# Patient Record
Sex: Male | Born: 1975 | Race: White | Hispanic: No | Smoking: Current every day smoker
Health system: Southern US, Community
[De-identification: ages and names within clinical notes are randomized; demographics above are authoritative.]

## PROBLEM LIST (undated history)

## (undated) ENCOUNTER — Emergency Department (HOSPITAL_COMMUNITY): Admission: EM | Payer: Self-pay | Source: Home / Self Care

## (undated) DIAGNOSIS — A4902 Methicillin resistant Staphylococcus aureus infection, unspecified site: Secondary | ICD-10-CM

## (undated) DIAGNOSIS — E7800 Pure hypercholesterolemia, unspecified: Secondary | ICD-10-CM

## (undated) DIAGNOSIS — N289 Disorder of kidney and ureter, unspecified: Secondary | ICD-10-CM

## (undated) DIAGNOSIS — L0293 Carbuncle, unspecified: Secondary | ICD-10-CM

## (undated) DIAGNOSIS — G4730 Sleep apnea, unspecified: Secondary | ICD-10-CM

---

## 2001-03-14 ENCOUNTER — Ambulatory Visit: Admission: RE | Admit: 2001-03-14 | Discharge: 2001-03-14 | Payer: Self-pay | Admitting: Internal Medicine

## 2001-08-14 ENCOUNTER — Emergency Department (HOSPITAL_COMMUNITY): Admission: EM | Admit: 2001-08-14 | Discharge: 2001-08-14 | Payer: Self-pay | Admitting: Emergency Medicine

## 2001-08-14 ENCOUNTER — Encounter: Payer: Self-pay | Admitting: Emergency Medicine

## 2002-01-01 ENCOUNTER — Emergency Department (HOSPITAL_COMMUNITY): Admission: EM | Admit: 2002-01-01 | Discharge: 2002-01-01 | Payer: Self-pay | Admitting: Emergency Medicine

## 2002-01-01 ENCOUNTER — Encounter: Payer: Self-pay | Admitting: Emergency Medicine

## 2003-02-25 ENCOUNTER — Encounter: Payer: Self-pay | Admitting: Orthopedic Surgery

## 2003-02-25 ENCOUNTER — Ambulatory Visit (HOSPITAL_COMMUNITY): Admission: RE | Admit: 2003-02-25 | Discharge: 2003-02-25 | Payer: Self-pay | Admitting: Orthopedic Surgery

## 2003-07-10 ENCOUNTER — Other Ambulatory Visit: Admission: RE | Admit: 2003-07-10 | Discharge: 2003-07-10 | Payer: Self-pay | Admitting: Urology

## 2005-10-01 ENCOUNTER — Emergency Department (HOSPITAL_COMMUNITY): Admission: EM | Admit: 2005-10-01 | Discharge: 2005-10-01 | Payer: Self-pay | Admitting: Emergency Medicine

## 2005-10-12 ENCOUNTER — Ambulatory Visit (HOSPITAL_COMMUNITY): Admission: RE | Admit: 2005-10-12 | Discharge: 2005-10-12 | Payer: Self-pay | Admitting: Internal Medicine

## 2005-11-11 ENCOUNTER — Ambulatory Visit (HOSPITAL_COMMUNITY): Admission: RE | Admit: 2005-11-11 | Discharge: 2005-11-11 | Payer: Self-pay | Admitting: Internal Medicine

## 2005-12-03 ENCOUNTER — Emergency Department (HOSPITAL_COMMUNITY): Admission: EM | Admit: 2005-12-03 | Discharge: 2005-12-03 | Payer: Self-pay | Admitting: Emergency Medicine

## 2005-12-04 ENCOUNTER — Inpatient Hospital Stay (HOSPITAL_COMMUNITY): Admission: EM | Admit: 2005-12-04 | Discharge: 2005-12-07 | Payer: Self-pay | Admitting: Emergency Medicine

## 2006-02-14 ENCOUNTER — Ambulatory Visit (HOSPITAL_COMMUNITY): Admission: RE | Admit: 2006-02-14 | Discharge: 2006-02-14 | Payer: Self-pay | Admitting: Internal Medicine

## 2006-04-02 ENCOUNTER — Emergency Department (HOSPITAL_COMMUNITY): Admission: EM | Admit: 2006-04-02 | Discharge: 2006-04-02 | Payer: Self-pay | Admitting: Emergency Medicine

## 2006-06-17 ENCOUNTER — Emergency Department (HOSPITAL_COMMUNITY): Admission: EM | Admit: 2006-06-17 | Discharge: 2006-06-17 | Payer: Self-pay | Admitting: Emergency Medicine

## 2007-03-18 ENCOUNTER — Emergency Department (HOSPITAL_COMMUNITY): Admission: EM | Admit: 2007-03-18 | Discharge: 2007-03-18 | Payer: Self-pay | Admitting: Emergency Medicine

## 2007-03-31 ENCOUNTER — Emergency Department (HOSPITAL_COMMUNITY): Admission: EM | Admit: 2007-03-31 | Discharge: 2007-03-31 | Payer: Self-pay | Admitting: Emergency Medicine

## 2007-09-24 ENCOUNTER — Emergency Department (HOSPITAL_COMMUNITY): Admission: EM | Admit: 2007-09-24 | Discharge: 2007-09-24 | Payer: Self-pay | Admitting: Emergency Medicine

## 2007-09-28 ENCOUNTER — Emergency Department (HOSPITAL_COMMUNITY): Admission: EM | Admit: 2007-09-28 | Discharge: 2007-09-29 | Payer: Self-pay | Admitting: Emergency Medicine

## 2007-10-03 ENCOUNTER — Ambulatory Visit: Payer: Self-pay | Admitting: Pulmonary Disease

## 2007-10-03 DIAGNOSIS — G4733 Obstructive sleep apnea (adult) (pediatric): Secondary | ICD-10-CM | POA: Insufficient documentation

## 2007-11-01 ENCOUNTER — Encounter: Payer: Self-pay | Admitting: Pulmonary Disease

## 2007-11-15 ENCOUNTER — Encounter: Payer: Self-pay | Admitting: Pulmonary Disease

## 2010-03-14 ENCOUNTER — Emergency Department (HOSPITAL_COMMUNITY): Admission: EM | Admit: 2010-03-14 | Discharge: 2010-03-14 | Payer: Self-pay | Admitting: Emergency Medicine

## 2010-09-24 NOTE — Discharge Summary (Signed)
[name], Dylan Shelton NO.:  [id number]   MEDICAL RECORD NO.:  [id number]          PATIENT TYPE:  INP   LOCATION:  A331                          FACILITY:  [facility]   PHYSICIAN:  Kingsley Callander. Ouida Sills, MD       DATE OF BIRTH:  1976/04/08   DATE OF ADMISSION:  12/04/2005  DATE OF DISCHARGE:  08/01/2007LH                                 DISCHARGE SUMMARY   DISCHARGE DIAGNOSIS:  Left thigh boil with cellulitis   HOSPITAL COURSE:  This patient is a 35 year old male who presented with  swelling, redness and induration in the left inner thigh.  He had undergone  incision and drainage in the emergency room 1 day prior to admission.  He  returned with expanding erythema and induration involving most of the inner  left thigh.  His white count was 13.1.  Blood cultures were drawn and he was  started on IV vancomycin.  Blood cultures remained negative, fever resolved.  He was seen in follow-up by Dr. Lovell Sheehan.  Additional pus was expressed but  he did not require an additional procedure.  His erythema and induration  markedly improved by 8/1 and he is felt to be stable for discharge with  outpatient oral antibiotic therapy   He will follow-up in the office in next Monday.  He will remain out of work  until then.   DISCHARGE MEDICATIONS:  Bactrim DS b.i.d.   He will also use warm moist compresses to the boil area q.i.d.      Kingsley Callander. Ouida Sills, MD  Electronically Signed     ROF/MEDQ  D:  12/07/2005  T:  12/07/2005  Job:  [id number]

## 2010-09-24 NOTE — H&P (Signed)
Dylan Shelton, BISHOP NO.:  [id number]   MEDICAL RECORD NO.:  [id number]          PATIENT TYPE:  [facility]   LOCATION:  A331                          FACILITY:  [facility]   PHYSICIAN:  Kingsley Callander. Ouida Sills, MD       DATE OF BIRTH:  01-24-1976   DATE OF ADMISSION:  12/04/2005  DATE OF DISCHARGE:  LH                                HISTORY & PHYSICAL   HISTORY OF PRESENT ILLNESS:  This patient is a 35 year old white male who  presented to the emergency room with an enlarging area of redness  surrounding a boil on the left inner thigh.  Had been seen in the emergency  room one day prior to that and had had an incision and drainage with a small  amount of packing being placed.  He had been treated with Vicodin and  Bactrim.  He returned, though, with a much more extensive area of redness  involving the thigh.  He was not febrile.  He had a mild leukocytosis.  He  was hospitalized and started on IV antibiotics.   PAST MEDICAL HISTORY:  1. Sleep apnea.  2. 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   MEDICATIONS:  Bactrim, Vicodin.   ALLERGIES:  None.   SOCIAL HISTORY:  He smokes a pack cigarettes per day.  He does not use drugs  or alcohol.   REVIEW OF SYSTEMS:  Noncontributory.   PHYSICAL EXAMINATION:  VITAL SIGNS:  Temperature 98.7, blood pressure  123/67, pulse 75, respirations 20.  GENERAL:  Alert and oriented.  HEENT:  No scleral icterus.  Pharynx normal.  NECK:  Supple without thyromegaly or lymphadenopathy.  LUNGS:  Clear.  HEART:  Regular with no murmurs.  ABDOMEN:  Nontender.  No hepatosplenomegaly.  EXTREMITIES:  He had golf ball sized mass in the left inner thigh.  Small  amount plus was expressed.  There is surrounding erythema encompassing most  of the left inner thigh.  The right leg is normal.  NEURO:  Grossly intact.  Lymph nodes no enlargement.   LABORATORY DATA:  White count 13.1, hemoglobin 14.5, platelets 255,000.  Sodium 138, potassium 4.3,  bicarbonate 28, glucose 88, BUN 9, creatinine  1.1, calcium 8.2.   IMPRESSION:  Left thigh boil/abscess with surrounding cellulitis.  Blood  cultures have been drawn.  He has been started on intravenous vancomycin.  Surgery will be consulted for additional incision and drainage.      Kingsley Callander. Ouida Sills, MD  Electronically Signed     ROF/MEDQ  D:  12/05/2005  T:  12/05/2005  Job:  [id number]

## 2011-01-30 ENCOUNTER — Emergency Department (HOSPITAL_COMMUNITY)
Admission: EM | Admit: 2011-01-30 | Discharge: 2011-01-30 | Disposition: A | Discharge status: Home / Self Care | Payer: BC Managed Care – PPO | Attending: Emergency Medicine | Admitting: Emergency Medicine

## 2011-01-30 ENCOUNTER — Encounter: Payer: Self-pay | Admitting: *Deleted

## 2011-01-30 DIAGNOSIS — Z8614 Personal history of Methicillin resistant Staphylococcus aureus infection: Secondary | ICD-10-CM | POA: Insufficient documentation

## 2011-01-30 DIAGNOSIS — F17200 Nicotine dependence, unspecified, uncomplicated: Secondary | ICD-10-CM | POA: Insufficient documentation

## 2011-01-30 DIAGNOSIS — L989 Disorder of the skin and subcutaneous tissue, unspecified: Secondary | ICD-10-CM | POA: Insufficient documentation

## 2011-01-30 HISTORY — DX: Disorder of kidney and ureter, unspecified: N28.9

## 2011-01-30 HISTORY — DX: Carbuncle, unspecified: L02.93

## 2011-01-30 HISTORY — DX: Methicillin resistant Staphylococcus aureus infection, unspecified site: A49.02

## 2011-01-30 HISTORY — DX: Sleep apnea, unspecified: G47.30

## 2011-01-30 MED ORDER — DOXYCYCLINE HYCLATE 100 MG PO CAPS: 100.0000 mg | ORAL_CAPSULE | Freq: Two times a day (BID) | ORAL | Status: AC

## 2011-01-30 MED ORDER — DOXYCYCLINE HYCLATE 100 MG PO TABS
100.0000 mg | ORAL_TABLET | Freq: Once | ORAL | Status: AC
  Administered 2011-01-30: 100 mg via ORAL
  Filled 2011-01-30: qty 1

## 2011-01-30 MED ORDER — IBUPROFEN 800 MG PO TABS
800.0000 mg | ORAL_TABLET | Freq: Once | ORAL | Status: AC
  Administered 2011-01-30: 800 mg via ORAL
  Filled 2011-01-30: qty 1

## 2011-01-30 NOTE — ED Notes (Signed)
Pt c/o abscess to back of head x 3-4 days ago

## 2011-01-30 NOTE — ED Provider Notes (Signed)
History     CSN: [id number] Arrival date & time: 01/30/2011  3:11 PM  Chief Complaint  Patient presents with  . Abscess    HPI  (Consider location/radiation/quality/duration/timing/severity/associated sxs/prior treatment)  HPI Comments: Pt has had multiple MRSA lesions in the past.  He has a small area to the L parietal scalp that he thought was a developing abscess.  His spouse cut it with a razor last PM but saw no drainage.  Denies fever.  Patient is a 35 y.o. male presenting with abscess. The history is provided by the patient. No language interpreter was used.  Abscess  This is a new problem. The current episode started less than one week ago. The onset was gradual. The problem has been unchanged. The abscess is present on the scalp. The problem is mild. The abscess is characterized by painfulness. It is unknown what he was exposed to. The abscess first occurred at home. His past medical history is significant for skin abscesses in family. There were no sick contacts. He has received no recent medical care.    Past Medical History  Diagnosis Date  . Renal disorder   . Recurrent boils   . MRSA (methicillin resistant Staphylococcus aureus)   . Sleep apnea     History reviewed. No pertinent past surgical history.  History reviewed. No pertinent family history.  History  Substance Use Topics  . Smoking status: Current Everyday Smoker    Types: Cigarettes  . Smokeless tobacco: Not on file  . Alcohol Use: Yes     occasionally      Review of Systems  Review of Systems  Skin:       Skin lesion  All other systems reviewed and are negative.    Allergies  Review of patient's allergies indicates not on file.  Home Medications  No current outpatient prescriptions on file.  Physical Exam    BP 127/79  Pulse 62  Temp(Src) 98.7 F (37.1 C) (Oral)  Resp 18  Ht 5\' 10"  (1.778 m)  Wt 210 lb (95.255 kg)  BMI 30.13 kg/m2  SpO2 99%  Physical Exam  Nursing note and  vitals reviewed. Constitutional: He is oriented to person, place, and time. He appears well-developed and well-nourished. No distress.  HENT:  Head: Normocephalic and atraumatic.    Eyes: EOM are normal.  Neck: Normal range of motion.  Cardiovascular: Normal rate, regular rhythm and intact distal pulses.   Pulmonary/Chest: Effort normal. No respiratory distress.  Abdominal: Soft.  Musculoskeletal: Normal range of motion.  Neurological: He is alert and oriented to person, place, and time.  Skin: Skin is warm and dry. Lesion noted. He is not diaphoretic.  Psychiatric: He has a normal mood and affect. Judgment normal.    ED Course  Procedures (including critical care time)  Labs Reviewed - No data to display No results found.   No diagnosis found.   MDM         Worthy Rancher, PA 01/30/11 [phone]

## 2011-01-30 NOTE — ED Notes (Signed)
Pt has small abscess on left occipital  Lower scalp.  Area cleansed with SAF-CLEN.  Fingerling  Like abscess noted that radiates toward neck.

## 2011-02-02 LAB — URINALYSIS, ROUTINE W REFLEX MICROSCOPIC
Bilirubin Urine: NEGATIVE
Ketones, ur: 15 — AB
Nitrite: NEGATIVE
Nitrite: NEGATIVE
Protein, ur: NEGATIVE
Specific Gravity, Urine: 1.02
Specific Gravity, Urine: 1.025
Urobilinogen, UA: 0.2
Urobilinogen, UA: 0.2

## 2011-02-02 LAB — BASIC METABOLIC PANEL
BUN: 16
CO2: 20
Calcium: 9.6
Chloride: 106
GFR calc Af Amer: >60
GFR calc non Af Amer: >60
Glucose, Bld: 105 — ABNORMAL HIGH
Glucose, Bld: 113 — ABNORMAL HIGH
Potassium: 3 — ABNORMAL LOW
Sodium: 136

## 2011-02-02 LAB — DIFFERENTIAL
Basophils Absolute: 0.1
Basophils Absolute: 0.1
Basophils Relative: 1
Basophils Relative: 1
Eosinophils Relative: 1
Eosinophils Relative: 2
Lymphocytes Relative: 48 — ABNORMAL HIGH
Monocytes Absolute: 1
Monocytes Relative: 6
Neutro Abs: 5.9

## 2011-02-02 LAB — URINE MICROSCOPIC-ADD ON

## 2011-02-02 LAB — CBC
HCT: 42
HCT: 42.2
Hemoglobin: 14.9
MCHC: 35
MCHC: 35.4
MCV: 89.1
Platelets: 260
RBC: 4.72
RDW: 12.4
RDW: 12.4

## 2011-02-25 NOTE — ED Provider Notes (Signed)
Evaluation and management procedures were performed by the PA/NP under my supervision/collaboration.    Kaedance Magos D Euva Rundell, MD 02/25/11 1806 

## 2012-01-19 ENCOUNTER — Emergency Department (HOSPITAL_COMMUNITY)
Admission: EM | Admit: 2012-01-19 | Discharge: 2012-01-20 | Disposition: A | Discharge status: Home / Self Care | Payer: BC Managed Care – PPO | Attending: Emergency Medicine | Admitting: Emergency Medicine

## 2012-01-19 ENCOUNTER — Encounter (HOSPITAL_COMMUNITY): Payer: Self-pay

## 2012-01-19 ENCOUNTER — Emergency Department (HOSPITAL_COMMUNITY)
Admission: EM | Admit: 2012-01-19 | Discharge: 2012-01-19 | Disposition: A | Discharge status: Home / Self Care | Payer: BC Managed Care – PPO | Attending: Emergency Medicine | Admitting: Emergency Medicine

## 2012-01-19 ENCOUNTER — Encounter (HOSPITAL_COMMUNITY): Payer: Self-pay | Admitting: Emergency Medicine

## 2012-01-19 DIAGNOSIS — N289 Disorder of kidney and ureter, unspecified: Secondary | ICD-10-CM | POA: Insufficient documentation

## 2012-01-19 DIAGNOSIS — L0211 Cutaneous abscess of neck: Secondary | ICD-10-CM | POA: Insufficient documentation

## 2012-01-19 DIAGNOSIS — G4730 Sleep apnea, unspecified: Secondary | ICD-10-CM | POA: Insufficient documentation

## 2012-01-19 DIAGNOSIS — F17200 Nicotine dependence, unspecified, uncomplicated: Secondary | ICD-10-CM | POA: Insufficient documentation

## 2012-01-19 DIAGNOSIS — L03221 Cellulitis of neck: Secondary | ICD-10-CM | POA: Insufficient documentation

## 2012-01-19 DIAGNOSIS — L0291 Cutaneous abscess, unspecified: Secondary | ICD-10-CM

## 2012-01-19 DIAGNOSIS — Z8614 Personal history of Methicillin resistant Staphylococcus aureus infection: Secondary | ICD-10-CM | POA: Insufficient documentation

## 2012-01-19 DIAGNOSIS — L02219 Cutaneous abscess of trunk, unspecified: Secondary | ICD-10-CM | POA: Insufficient documentation

## 2012-01-19 DIAGNOSIS — E7800 Pure hypercholesterolemia, unspecified: Secondary | ICD-10-CM | POA: Insufficient documentation

## 2012-01-19 HISTORY — DX: Pure hypercholesterolemia, unspecified: E78.00

## 2012-01-19 MED ORDER — SULFAMETHOXAZOLE-TRIMETHOPRIM 800-160 MG PO TABS: 1.0000 | ORAL_TABLET | Freq: Two times a day (BID) | ORAL | Status: AC

## 2012-01-19 MED ORDER — SULFAMETHOXAZOLE-TMP DS 800-160 MG PO TABS
1.0000 | ORAL_TABLET | Freq: Once | ORAL | Status: AC
  Administered 2012-01-19: 1 via ORAL
  Filled 2012-01-19: qty 1

## 2012-01-19 MED ORDER — CEPHALEXIN 500 MG PO CAPS
500.0000 mg | ORAL_CAPSULE | Freq: Once | ORAL | Status: AC
  Administered 2012-01-19: 500 mg via ORAL
  Filled 2012-01-19: qty 1

## 2012-01-19 MED ORDER — HYDROCODONE-ACETAMINOPHEN 5-325 MG PO TABS
2.0000 | ORAL_TABLET | Freq: Once | ORAL | Status: AC
  Administered 2012-01-19: 2 via ORAL
  Filled 2012-01-19: qty 2

## 2012-01-19 NOTE — ED Provider Notes (Signed)
History  This chart was scribed for Dylan Kaplan, MD by [name]. This patient was seen in room APA11/[facility]11 and the patient's care was started at 2142.   CSN: [id number]  Arrival date & time 01/19/12  2142   First MD Initiated Contact with Patient 01/19/12 2158      Chief Complaint  Patient presents with  . Recurrent Skin Infections   The history is provided by the patient. No language interpreter was used.   Dylan Shelton is a 36 y.o. male who presents to the Emergency Department complaining of a posterior neck abscess that was recently drained yesterday and has gradually worsened in pain and swelling. He states that he was taking a shower about 10 hours ago and the packing fell out from previous I&D from about 24 hours ago. He states since then it has been more painful and he thinks there is pus in it again. He denies any fever.    Past Medical History  Diagnosis Date  . Renal disorder   . Recurrent boils   . MRSA (methicillin resistant Staphylococcus aureus)   . Sleep apnea   . High cholesterol     History reviewed. No pertinent past surgical history.  History reviewed. No pertinent family history.  History  Substance Use Topics  . Smoking status: Current Every Day Smoker    Types: Cigarettes  . Smokeless tobacco: Not on file  . Alcohol Use: Yes     occasionally      Review of Systems  Constitutional: Negative for fever and chills.  HENT: Negative for congestion and rhinorrhea.   Respiratory: Negative for shortness of breath.   Cardiovascular: Negative for chest pain.  Gastrointestinal: Negative for nausea, vomiting and abdominal pain.  Skin: Positive for wound (abscess on the back of his neck. ).  Neurological: Negative for weakness.  All other systems reviewed and are negative.    Allergies  Review of patient's allergies indicates no known allergies.  Home Medications   Current Outpatient Rx  Name Route Sig Dispense Refill  .  SULFAMETHOXAZOLE-TRIMETHOPRIM 800-160 MG PO TABS Oral Take 1 tablet by mouth every 12 (twelve) hours. 10 tablet 0    Triage Vitals: BP 148/81  Pulse 81  Temp 98.8 F (37.1 C) (Oral)  Resp 16  Ht 5\' 10"  (1.778 m)  Wt 205 lb (92.987 kg)  BMI 29.41 kg/m2  SpO2 98%  Physical Exam  Nursing note and vitals reviewed. Constitutional: He is oriented to person, place, and time. He appears well-developed and well-nourished. No distress.  HENT:  Head: Normocephalic and atraumatic.  Eyes: EOM are normal.  Neck: Neck supple. No tracheal deviation present.  Cardiovascular: Normal rate, regular rhythm and normal heart sounds.   Pulmonary/Chest: Effort normal and breath sounds normal. No respiratory distress. He has no wheezes. He has no rales.  Musculoskeletal: Normal range of motion.  Neurological: He is alert and oriented to person, place, and time.  Skin: Skin is warm and dry.       Base of neck with a 9 x 4 cm lesion which is indurated erythematous and has middle white pustule. No fluctuance, little purulent discharge.   Psychiatric: He has a normal mood and affect. His behavior is normal.    ED Course  INCISION AND DRAINAGE Date/Time: 01/20/2012 12:28 AM Performed by: Dylan Shelton Authorized by: Dylan Shelton Consent: Verbal consent obtained. Risks and benefits: risks, benefits and alternatives were discussed Consent given by: patient Site marked: the operative site was marked  Patient identity confirmed: verbally with patient Type: abscess Body area: head/neck Anesthesia: local infiltration Local anesthetic: lidocaine 1% with epinephrine Anesthetic total: 5 ml Patient sedated: no Scalpel size: 11 Needle gauge: 20 Incision type: single straight Complexity: simple Drainage: purulent and bloody Drainage amount: scant Wound treatment: wound left open and drain placed Packing material: 1/2 in iodoform gauze Patient tolerance: Patient tolerated the procedure well with no  immediate complications.   (including critical care time) DIAGNOSTIC STUDIES: Oxygen Saturation is 98% on room air, normal by my interpretation.    COORDINATION OF CARE: At 1020 PM Discussed treatment plan with patient which includes pain medicine, and antibiotics. Patient agrees.   Labs Reviewed - No data to display No results found.   No diagnosis found.    MDM  Medical screening examination/treatment/procedure(s) were performed by me as the supervising physician. Scribe service was utilized for documentation only.  Pt has an abscess, and there is surrounding cellulitis. I was able to express some pus just on exam - will get i&d. Advised to fill prescription this time.        Dylan Kaplan, MD 01/20/12 [phone]

## 2012-01-19 NOTE — ED Notes (Signed)
Was seen this morning around 2 am for a boil on the back of my neck, they lanced it and now it is infected per pt.

## 2012-01-19 NOTE — ED Provider Notes (Signed)
History    68 six-year-old male with a painful lesion of his upper back. First noticed yesterday. Since and has progressively become more painful and larger in size. His significant other tried popping it earlier with some bloody-appearing drainage. Patient with history of recurrent abscess. Denies history of diabetes. No fevers or chills. No Nausea or vomiting. CSN: [id number]  Arrival date & time 01/19/12  0159   First MD Initiated Contact with Patient 01/19/12 0215      Chief Complaint  Patient presents with  . Abscess    (Consider location/radiation/quality/duration/timing/severity/associated sxs/prior treatment) HPI  Past Medical History  Diagnosis Date  . Renal disorder   . Recurrent boils   . MRSA (methicillin resistant Staphylococcus aureus)   . Sleep apnea   . High cholesterol     History reviewed. No pertinent past surgical history.  No family history on file.  History  Substance Use Topics  . Smoking status: Current Every Day Smoker    Types: Cigarettes  . Smokeless tobacco: Not on file  . Alcohol Use: Yes     occasionally      Review of Systems   Review of symptoms negative unless otherwise noted in HPI.   Allergies  Review of patient's allergies indicates no known allergies.  Home Medications   Current Outpatient Rx  Name Route Sig Dispense Refill  . SULFAMETHOXAZOLE-TRIMETHOPRIM 800-160 MG PO TABS Oral Take 1 tablet by mouth every 12 (twelve) hours. 10 tablet 0    BP 152/87  Pulse 77  Temp 98.4 F (36.9 C) (Oral)  Resp 18  Ht 5\' 10"  (1.778 m)  Wt 205 lb (92.987 kg)  BMI 29.41 kg/m2  SpO2 97%  Physical Exam  Nursing note and vitals reviewed. Constitutional: He appears well-developed and well-nourished. No distress.  HENT:  Head: Normocephalic and atraumatic.  Eyes: Conjunctivae normal are normal. Right eye exhibits no discharge. Left eye exhibits no discharge.  Neck: Neck supple.  Cardiovascular: Normal rate, regular rhythm and  normal heart sounds.  Exam reveals no gallop and no friction rub.   No murmur heard. Pulmonary/Chest: Effort normal and breath sounds normal. No respiratory distress.  Abdominal: Soft. He exhibits no distension. There is no tenderness.  Musculoskeletal: He exhibits no edema and no tenderness.  Neurological: He is alert.  Skin: Skin is warm and dry.       Approximately 2 cm lesion near the midline of the upper back consistent with an abscess. Fluctuant. Mild surrounding induration. No surrounding cellulitis. No spontaneous drainage.  Psychiatric: He has a normal mood and affect. His behavior is normal. Thought content normal.    ED Course  Procedures (including critical care time)  INCISION AND DRAINAGE Performed by: Raeford Razor Consent: Verbal consent obtained. Risks and benefits: risks, benefits and alternatives were discussed Type: abscess  Body area: Upper back  Anesthesia: local infiltration  Local anesthetic: lidocaine 2 % with epinephrine  Anesthetic total: 3 ml  Complexity: complex Blunt dissection to break up loculations  Drainage: purulent  Drainage amount: Small   Packing material: 1/4 in iodoform gauze  Patient tolerance: Patient tolerated the procedure well with no immediate complications.     Labs Reviewed - No data to display No results found.   1. Abscess       MDM  36 year old male with an abscess to his upper back. This was incised and drained. No surrounding cellulitis. Patient and significant other concerned b/c of prior history of abscess add what sounds like cellulitis previously requiring  hospital admission. Although there does not appear to be any complicating features of his current presentation, we'll give a course of antibiotics per their request. Continued wound care return precautions were discussed.        Raeford Razor, MD 01/19/12 [phone]

## 2012-01-19 NOTE — ED Notes (Signed)
Patient c/o abscess to upper back at neck; states came up yesterday.

## 2012-01-20 MED ORDER — SULFAMETHOXAZOLE-TRIMETHOPRIM 800-160 MG PO TABS: 1.0000 | ORAL_TABLET | Freq: Two times a day (BID) | ORAL | Status: AC

## 2012-01-20 MED ORDER — CEPHALEXIN 500 MG PO CAPS: 500.0000 mg | ORAL_CAPSULE | Freq: Four times a day (QID) | ORAL | Status: AC

## 2012-01-20 MED ORDER — LIDOCAINE HCL (PF) 1 % IJ SOLN
INTRAMUSCULAR | Status: AC
  Filled 2012-01-20: qty 5

## 2012-01-20 MED ORDER — IBUPROFEN 600 MG PO TABS: 600.0000 mg | ORAL_TABLET | Freq: Four times a day (QID) | ORAL | Status: AC | PRN

## 2012-04-25 ENCOUNTER — Ambulatory Visit (HOSPITAL_COMMUNITY)
Admission: RE | Admit: 2012-04-25 | Discharge: 2012-04-25 | Disposition: A | Discharge status: Home / Self Care | Payer: BC Managed Care – PPO | Source: Ambulatory Visit | Attending: Physician Assistant | Admitting: Physician Assistant

## 2012-04-25 ENCOUNTER — Other Ambulatory Visit (HOSPITAL_COMMUNITY): Payer: Self-pay | Admitting: Physician Assistant

## 2012-04-25 DIAGNOSIS — M545 Low back pain, unspecified: Secondary | ICD-10-CM

## 2015-09-24 DIAGNOSIS — Z6830 Body mass index (BMI) 30.0-30.9, adult: Secondary | ICD-10-CM | POA: Diagnosis not present

## 2015-09-24 DIAGNOSIS — G4733 Obstructive sleep apnea (adult) (pediatric): Secondary | ICD-10-CM | POA: Diagnosis not present

## 2015-09-24 DIAGNOSIS — J069 Acute upper respiratory infection, unspecified: Secondary | ICD-10-CM | POA: Diagnosis not present

## 2015-09-24 DIAGNOSIS — Z1389 Encounter for screening for other disorder: Secondary | ICD-10-CM | POA: Diagnosis not present

## 2015-09-28 DIAGNOSIS — R070 Pain in throat: Secondary | ICD-10-CM | POA: Diagnosis not present

## 2015-09-28 DIAGNOSIS — Z1389 Encounter for screening for other disorder: Secondary | ICD-10-CM | POA: Diagnosis not present

## 2015-09-28 DIAGNOSIS — Z6829 Body mass index (BMI) 29.0-29.9, adult: Secondary | ICD-10-CM | POA: Diagnosis not present

## 2015-09-28 DIAGNOSIS — J069 Acute upper respiratory infection, unspecified: Secondary | ICD-10-CM | POA: Diagnosis not present

## 2015-10-02 DIAGNOSIS — Z6829 Body mass index (BMI) 29.0-29.9, adult: Secondary | ICD-10-CM | POA: Diagnosis not present

## 2015-10-02 DIAGNOSIS — J039 Acute tonsillitis, unspecified: Secondary | ICD-10-CM | POA: Diagnosis not present

## 2015-10-02 DIAGNOSIS — J029 Acute pharyngitis, unspecified: Secondary | ICD-10-CM | POA: Diagnosis not present

## 2015-10-21 DIAGNOSIS — F419 Anxiety disorder, unspecified: Secondary | ICD-10-CM | POA: Diagnosis not present

## 2015-10-21 DIAGNOSIS — F329 Major depressive disorder, single episode, unspecified: Secondary | ICD-10-CM | POA: Diagnosis not present

## 2015-10-21 DIAGNOSIS — Z1389 Encounter for screening for other disorder: Secondary | ICD-10-CM | POA: Diagnosis not present

## 2015-10-21 DIAGNOSIS — I889 Nonspecific lymphadenitis, unspecified: Secondary | ICD-10-CM | POA: Diagnosis not present

## 2015-10-21 DIAGNOSIS — Z6829 Body mass index (BMI) 29.0-29.9, adult: Secondary | ICD-10-CM | POA: Diagnosis not present

## 2015-10-26 DIAGNOSIS — G4733 Obstructive sleep apnea (adult) (pediatric): Secondary | ICD-10-CM | POA: Diagnosis not present

## 2016-02-15 DIAGNOSIS — Z6829 Body mass index (BMI) 29.0-29.9, adult: Secondary | ICD-10-CM | POA: Diagnosis not present

## 2016-02-15 DIAGNOSIS — L0291 Cutaneous abscess, unspecified: Secondary | ICD-10-CM | POA: Diagnosis not present

## 2016-02-15 DIAGNOSIS — Z1389 Encounter for screening for other disorder: Secondary | ICD-10-CM | POA: Diagnosis not present

## 2016-11-02 ENCOUNTER — Encounter: Payer: Self-pay | Admitting: Neurology

## 2016-11-02 ENCOUNTER — Ambulatory Visit (HOSPITAL_COMMUNITY)
Admission: RE | Admit: 2016-11-02 | Discharge: 2016-11-02 | Disposition: A | Discharge status: Home / Self Care | Payer: BLUE CROSS/BLUE SHIELD | Source: Ambulatory Visit | Attending: Registered Nurse | Admitting: Registered Nurse

## 2016-11-02 ENCOUNTER — Other Ambulatory Visit (HOSPITAL_COMMUNITY): Payer: Self-pay | Admitting: Registered Nurse

## 2016-11-02 DIAGNOSIS — G4452 New daily persistent headache (NDPH): Secondary | ICD-10-CM | POA: Diagnosis not present

## 2016-11-02 DIAGNOSIS — Z6831 Body mass index (BMI) 31.0-31.9, adult: Secondary | ICD-10-CM | POA: Diagnosis not present

## 2016-11-02 DIAGNOSIS — G43909 Migraine, unspecified, not intractable, without status migrainosus: Secondary | ICD-10-CM | POA: Diagnosis not present

## 2016-11-02 DIAGNOSIS — F419 Anxiety disorder, unspecified: Secondary | ICD-10-CM | POA: Diagnosis not present

## 2016-11-02 DIAGNOSIS — G5603 Carpal tunnel syndrome, bilateral upper limbs: Secondary | ICD-10-CM | POA: Diagnosis not present

## 2016-11-02 DIAGNOSIS — R51 Headache: Secondary | ICD-10-CM | POA: Diagnosis not present

## 2016-11-02 MED ORDER — IOPAMIDOL (ISOVUE-300) INJECTION 61%
75.0000 mL | Freq: Once | INTRAVENOUS | Status: AC | PRN
  Administered 2016-11-02: 75 mL via INTRAVENOUS

## 2016-11-04 ENCOUNTER — Ambulatory Visit (HOSPITAL_COMMUNITY): Payer: Self-pay

## 2016-11-07 DIAGNOSIS — G5603 Carpal tunnel syndrome, bilateral upper limbs: Secondary | ICD-10-CM | POA: Diagnosis not present

## 2016-11-07 DIAGNOSIS — M542 Cervicalgia: Secondary | ICD-10-CM | POA: Diagnosis not present

## 2016-11-08 DIAGNOSIS — M5412 Radiculopathy, cervical region: Secondary | ICD-10-CM | POA: Diagnosis not present

## 2016-11-08 DIAGNOSIS — M542 Cervicalgia: Secondary | ICD-10-CM | POA: Diagnosis not present

## 2016-11-08 DIAGNOSIS — R202 Paresthesia of skin: Secondary | ICD-10-CM | POA: Diagnosis not present

## 2016-11-08 DIAGNOSIS — G5603 Carpal tunnel syndrome, bilateral upper limbs: Secondary | ICD-10-CM | POA: Insufficient documentation

## 2016-11-17 DIAGNOSIS — M5412 Radiculopathy, cervical region: Secondary | ICD-10-CM | POA: Diagnosis not present

## 2016-11-18 ENCOUNTER — Other Ambulatory Visit (HOSPITAL_COMMUNITY): Payer: Self-pay | Admitting: Orthopedic Surgery

## 2016-11-18 DIAGNOSIS — G43C0 Periodic headache syndromes in child or adult, not intractable: Secondary | ICD-10-CM

## 2016-11-18 DIAGNOSIS — M79601 Pain in right arm: Secondary | ICD-10-CM

## 2016-11-18 DIAGNOSIS — M79602 Pain in left arm: Secondary | ICD-10-CM

## 2016-11-18 DIAGNOSIS — M5412 Radiculopathy, cervical region: Secondary | ICD-10-CM

## 2016-11-24 ENCOUNTER — Ambulatory Visit (HOSPITAL_COMMUNITY)
Admission: RE | Admit: 2016-11-24 | Discharge: 2016-11-24 | Disposition: A | Discharge status: Home / Self Care | Payer: BLUE CROSS/BLUE SHIELD | Source: Ambulatory Visit | Attending: Orthopedic Surgery | Admitting: Orthopedic Surgery

## 2016-11-24 DIAGNOSIS — M79602 Pain in left arm: Secondary | ICD-10-CM | POA: Diagnosis not present

## 2016-11-24 DIAGNOSIS — M5412 Radiculopathy, cervical region: Secondary | ICD-10-CM | POA: Diagnosis present

## 2016-11-24 DIAGNOSIS — M79601 Pain in right arm: Secondary | ICD-10-CM | POA: Diagnosis not present

## 2016-11-24 DIAGNOSIS — M50221 Other cervical disc displacement at C4-C5 level: Secondary | ICD-10-CM | POA: Diagnosis not present

## 2016-11-24 DIAGNOSIS — M4802 Spinal stenosis, cervical region: Secondary | ICD-10-CM | POA: Diagnosis not present

## 2016-11-24 DIAGNOSIS — G43C0 Periodic headache syndromes in child or adult, not intractable: Secondary | ICD-10-CM | POA: Diagnosis not present

## 2016-11-30 ENCOUNTER — Encounter: Payer: Self-pay | Admitting: Neurology

## 2016-11-30 ENCOUNTER — Ambulatory Visit (INDEPENDENT_AMBULATORY_CARE_PROVIDER_SITE_OTHER): Payer: BLUE CROSS/BLUE SHIELD | Admitting: Neurology

## 2016-11-30 VITALS — BP 110/70 | HR 63 | Ht 69.0 in | Wt 208.2 lb

## 2016-11-30 DIAGNOSIS — M79603 Pain in arm, unspecified: Secondary | ICD-10-CM | POA: Diagnosis not present

## 2016-11-30 DIAGNOSIS — G43709 Chronic migraine without aura, not intractable, without status migrainosus: Secondary | ICD-10-CM | POA: Diagnosis not present

## 2016-11-30 MED ORDER — SUMATRIPTAN SUCCINATE 100 MG PO TABS: ORAL_TABLET | ORAL | 2 refills | Status: DC

## 2016-11-30 MED ORDER — TOPIRAMATE 50 MG PO TABS: 100.0000 mg | ORAL_TABLET | Freq: Every day | ORAL | 2 refills | Status: DC

## 2016-11-30 NOTE — Progress Notes (Signed)
NEUROLOGY CONSULTATION NOTE  Dylan LabDavid M Gallardo MRN: [id number] DOB: Nov 13, 1975  Referring provider: Dr. Sherwood GamblerFusco Primary care provider: Dr. Phillips OdorGolding  Reason for consult:  headache  HISTORY OF PRESENT ILLNESS: Dylan Shelton is a 41 year old right-handed male with OSA who presents for headache.  Semiology of headaches supplemented by PCP note.  Onset:  Around January 2018 Location:  right frontal Quality:  Mild is dull, severe is throbbing Intensity:  Mild is 4-5/10; severe is 8-9/10 Aura:  no Prodrome:  no Postdrome:  no Associated symptoms:  Osmophobia, nausea.  No photophobia and phonophobia. He has not had specific visual disturbance with the headache, but he notes seeing spots in his vision and needs to blink in order to focus.  He had a formal eye exam which was reportedly normal.  He has not had any new worse headache of his life, waking up from sleep Duration:  9-12 hours Frequency:  Daily (mild occurs 15 days per month; severe occurs 15 days per month) Frequency of abortive medication: daily Triggers/exacerbating factors:  Certain smells. Relieving factors:  no Activity:  aggravates  Past NSAIDS:  no Past analgesics:  Excedrin Past abortive triptans:  no Past muscle relaxants:  no Past anti-emetic:  no Past antihypertensive medications:  no Past antidepressant medications:  no Past anticonvulsant medications:  no Past vitamins/Herbal/Supplements:  no Other past therapies:  no  Current NSAIDS:  ibuprofen Current analgesics:  Goodys Current triptans:  no Current anti-emetic:  no Current muscle relaxants:  no Current anti-anxiolytic:  no Current sleep aide:  no Current Antihypertensive medications:  no Current Antidepressant medications:  escitalopram 20mg  Current Anticonvulsant medications:  topiramate 50mg  Current Vitamins/Herbal/Supplements:  no Current Antihistamines/Decongestants:  no Other therapy:  no  CT of head from 11/02/16 was personally reviewed and was  normal.  He has neck pain.  MRI of cervical spine from 11/24/16 was personally reviewed and revealed mild degenerative disc bulging at C4-5 through C6-7 with mild right C4 and C5 foraminal stenosis but no significant canal stenosis.  Caffeine:  Dr. Reino KentPepper daily Alcohol:  no Smoker:  yes Diet:  Tries to drink water. Exercise:  no Depression/anxiety:  yes Sleep hygiene:  Poor.  Has uncontrolled OSA. Family history of headache:  No Personal history of headache:  He had headaches as a child up through middle school.  PAST MEDICAL HISTORY: Past Medical History:  Diagnosis Date  . High cholesterol   . MRSA (methicillin resistant Staphylococcus aureus)   . Recurrent boils   . Renal disorder   . Sleep apnea     PAST SURGICAL HISTORY: History reviewed. No pertinent surgical history.  MEDICATIONS: No current outpatient prescriptions on file prior to visit.   No current facility-administered medications on file prior to visit.     ALLERGIES: No Known Allergies  FAMILY HISTORY: Family History  Problem Relation Age of Onset  . Melanoma Father   . Cancer Paternal Grandmother     SOCIAL HISTORY: Social History   Social History  . Marital status: Married    Spouse name: N/A  . Number of children: 4  . Years of education: 12   Occupational History  .  [employer]   Social History Main Topics  . Smoking status: Current Every Day Smoker    Types: Cigarettes  . Smokeless tobacco: Never Used  . Alcohol use Yes     Comment: occasionally  . Drug use: No  . Sexual activity: Not on file   Other Topics Concern  .  Not on file   Social History Narrative   Lives with wife, 4 children, brother in Social workerlaw and sister in Social workerlaw.  Works at [employer].  Education: high school.    REVIEW OF SYSTEMS: Constitutional: No fevers, chills, or sweats, no generalized fatigue, change in appetite Eyes: No visual changes, double vision, eye pain Ear, nose and throat: No hearing loss,  ear pain, nasal congestion, sore throat Cardiovascular: No chest pain, palpitations Respiratory:  No shortness of breath at rest or with exertion, wheezes GastrointestinaI: No nausea, vomiting, diarrhea, abdominal pain, fecal incontinence Genitourinary:  No dysuria, urinary retention or frequency Musculoskeletal:  No neck pain, back pain Integumentary: No rash, pruritus, skin lesions Neurological: as above Psychiatric: No depression, insomnia, anxiety Endocrine: No palpitations, fatigue, diaphoresis, mood swings, change in appetite, change in weight, increased thirst Hematologic/Lymphatic:  No purpura, petechiae. Allergic/Immunologic: no itchy/runny eyes, nasal congestion, recent allergic reactions, rashes  PHYSICAL EXAM: Vitals:   11/30/16 1311  BP: 110/70  Pulse: 63   General: No acute distress.  Patient appears well-groomed.  Head:  Normocephalic/atraumatic Eyes:  fundi examined but not visualized Neck: supple, no paraspinal tenderness, full range of motion Back: No paraspinal tenderness Heart: regular rate and rhythm Lungs: Clear to auscultation bilaterally. Vascular: No carotid bruits. Neurological Exam: Mental status: alert and oriented to person, place, and time, recent and remote memory intact, fund of knowledge intact, attention and concentration intact, speech fluent and not dysarthric, language intact. Cranial nerves: CN I: not tested CN II: pupils equal, round and reactive to light, visual fields intact CN III, IV, VI:  full range of motion, no nystagmus, no ptosis CN V: facial sensation intact CN VII: upper and lower face symmetric CN VIII: hearing intact CN IX, X: gag intact, uvula midline CN XI: sternocleidomastoid and trapezius muscles intact CN XII: tongue midline Bulk & Tone: normal, no fasciculations. Motor:  5/5 throughout  Sensation: temperature and vibration sensation intact. Deep Tendon Reflexes:  2+ throughout, toes downgoing.  Finger to nose  testing:  Without dysmetria.  Heel to shin:  Without dysmetria.  Gait:  Normal station and stride.  Able to turn and tandem walk. Romberg negative.  IMPRESSION: Chronic migraine complicated by medication overuse. Tobacco use disorder.  PLAN: 1.  Will increase topiramate to100mg  at bedtime.   2.  Take sumatriptan 100mg  for abortive therapy, limited to no more than 2 days out of the week to prevent rebound headache 4.  Stop caffeine, soda. 5.  Increase water intake 6.  Start routine exercise 7.  Quit smoking 8.  Recommend that PCP refer you to sleep specialist to address OSA. 9.  Proper diet, do not skip meals 10.  Follow up in 3 months.  Thank you for allowing me to take part in the care of this patient.  Shon MilletAdam Jaffe, DO  CC:  Elfredia NevinsLawrence Fusco, MD  Assunta FoundJohn Golding, MD

## 2016-11-30 NOTE — Patient Instructions (Signed)
Migraine Recommendations: 1.  I increased your topiramate to100mg  at bedtime.   2.  Take sumatriptan 100mg  at earliest onset of headache.  May repeat dose once in 2 hours if needed.  Do not exceed two tablets in 24 hours. 3.  Limit use of pain relievers to no more than 2 days out of the week to help reduce risk of rebound headaches.  I want you to stop all over the counter pain medications anyway (ibuprofen, Goodys, etc) 4.  Be aware of common food triggers such as processed sweets, processed foods with nitrites (such as deli meat, hot dogs, sausages), foods with MSG, alcohol (such as wine), chocolate, certain cheeses, certain fruits (dried fruits, some citrus fruit), vinegar, diet soda. 4.  Avoid caffeine.  Stop Dr. Reino KentPepper 5.  Routine exercise 6.  Proper sleep hygiene.  I highly recommend that you have Dr. Sherwood GamblerFusco send you to a sleep specialist for sleep apnea. 7.  Stay adequately hydrated with water 8.  Keep a headache diary. 9.  Maintain proper stress management. 10.  Do not skip meals. 11.  Consider supplements:  Magnesium citrate 400mg  to 600mg  daily, riboflavin 400mg , Coenzyme Q 10 100mg  three times daily 12.  Follow up in 3 months.

## 2016-12-03 ENCOUNTER — Encounter (HOSPITAL_COMMUNITY): Payer: Self-pay | Admitting: Emergency Medicine

## 2016-12-03 ENCOUNTER — Emergency Department (HOSPITAL_COMMUNITY)
Admission: EM | Admit: 2016-12-03 | Discharge: 2016-12-03 | Disposition: A | Discharge status: Home / Self Care | Payer: BLUE CROSS/BLUE SHIELD | Attending: Emergency Medicine | Admitting: Emergency Medicine

## 2016-12-03 DIAGNOSIS — G43009 Migraine without aura, not intractable, without status migrainosus: Secondary | ICD-10-CM | POA: Diagnosis not present

## 2016-12-03 DIAGNOSIS — F17210 Nicotine dependence, cigarettes, uncomplicated: Secondary | ICD-10-CM | POA: Diagnosis not present

## 2016-12-03 DIAGNOSIS — Z79899 Other long term (current) drug therapy: Secondary | ICD-10-CM | POA: Insufficient documentation

## 2016-12-03 DIAGNOSIS — R51 Headache: Secondary | ICD-10-CM | POA: Diagnosis present

## 2016-12-03 MED ORDER — SODIUM CHLORIDE 0.9 % IV BOLUS (SEPSIS)
1000.0000 mL | Freq: Once | INTRAVENOUS | Status: AC
  Administered 2016-12-03: 1000 mL via INTRAVENOUS

## 2016-12-03 MED ORDER — METOCLOPRAMIDE HCL 5 MG/ML IJ SOLN
10.0000 mg | Freq: Once | INTRAMUSCULAR | Status: AC
  Administered 2016-12-03: 10 mg via INTRAVENOUS
  Filled 2016-12-03: qty 2

## 2016-12-03 MED ORDER — SUMATRIPTAN SUCCINATE 25 MG PO TABS
100.0000 mg | ORAL_TABLET | Freq: Once | ORAL | Status: AC
  Administered 2016-12-03: 100 mg via ORAL
  Filled 2016-12-03: qty 4

## 2016-12-03 MED ORDER — KETOROLAC TROMETHAMINE 30 MG/ML IJ SOLN
30.0000 mg | Freq: Once | INTRAMUSCULAR | Status: AC
  Administered 2016-12-03: 30 mg via INTRAVENOUS
  Filled 2016-12-03: qty 1

## 2016-12-03 MED ORDER — DIPHENHYDRAMINE HCL 50 MG/ML IJ SOLN
25.0000 mg | Freq: Once | INTRAMUSCULAR | Status: AC
  Administered 2016-12-03: 25 mg via INTRAVENOUS
  Filled 2016-12-03: qty 1

## 2016-12-03 NOTE — ED Triage Notes (Signed)
Pt C/O migraine that started around 2300 last night. Pt tried taking topamax at 0230 with no relief. Pt states he vomited one time.

## 2016-12-03 NOTE — Discharge Instructions (Signed)
Keep yourself hydrated. Follow up with your neurologist. Return to the ED if you develop new or worsening symptoms.

## 2016-12-03 NOTE — ED Notes (Signed)
Pt ambulatory to waiting room. Pt verbalized understanding of discharge instructions.   

## 2016-12-03 NOTE — ED Provider Notes (Signed)
[facility]-EMERGENCY DEPT Provider Note   CSN: [id number] Arrival date & time: 12/03/16  0346     History   Chief Complaint Chief Complaint  Patient presents with  . Migraine    HPI Dylan Shelton is a 41 y.o. male.  Patient with history of chronic headaches, recently seen by neurology 3 days ago presenting with "migraine" this started last night. States he had a dull headache throughout the day that became progressively worse and prevented him from sleeping. Denies any thunderclap onset. States he's had multiple episodes of nausea and vomiting similar to previous migraines. He took Topamax prior to sleeping last night as well as Imitrex this morning at 2:15 without relief. Denies fever. Denies focal weakness, numbness or tingling. Denies any blurry vision or double vision. Has mild photophobia and phonophobia. Patient does have sleep apnea but is noncompliant with C Pap.   The history is provided by the patient.  Migraine  Associated symptoms include headaches. Pertinent negatives include no chest pain, no abdominal pain and no shortness of breath.    Past Medical History:  Diagnosis Date  . High cholesterol   . MRSA (methicillin resistant Staphylococcus aureus)   . Recurrent boils   . Renal disorder   . Sleep apnea     Patient Active Problem List   Diagnosis Date Noted  . OBSTRUCTIVE SLEEP APNEA 10/03/2007    History reviewed. No pertinent surgical history.     Home Medications    Prior to Admission medications   Medication Sig Start Date End Date Taking? Authorizing Provider  escitalopram (LEXAPRO) 10 MG tablet TK 1 T PO D 09/12/16  Yes [provider]  gabapentin (NEURONTIN) 300 MG capsule Take 300 mg by mouth 2 (two) times daily.   Yes [provider]  SUMAtriptan (IMITREX) 100 MG tablet Take 1 tablet earliest onset of migraine.  May repeat once in 2 hours if headache persists or recurs. 11/30/16  Yes Jaffe, Adam R, DO  topiramate (TOPAMAX) 50 MG tablet  Take 2 tablets (100 mg total) by mouth at bedtime. 11/30/16  Yes Drema DallasJaffe, Adam R, DO    Family History Family History  Problem Relation Age of Onset  . Melanoma Father   . Cancer Paternal Grandmother     Social History Social History  Substance Use Topics  . Smoking status: Current Every Day Smoker    Types: Cigarettes  . Smokeless tobacco: Never Used  . Alcohol use Yes     Comment: occasionally     Allergies   Patient has no known allergies.   Review of Systems Review of Systems  Constitutional: Negative for activity change, appetite change and fever.  HENT: Negative for congestion and rhinorrhea.   Eyes: Positive for photophobia.  Respiratory: Negative for apnea, cough, chest tightness and shortness of breath.   Cardiovascular: Negative for chest pain.  Gastrointestinal: Positive for nausea and vomiting. Negative for abdominal pain.  Endocrine: Negative for polyuria.  Genitourinary: Negative for dysuria, hematuria, testicular pain and urgency.  Musculoskeletal: Negative for arthralgias and myalgias.  Skin: Negative for rash.  Neurological: Positive for headaches. Negative for dizziness, weakness and light-headedness.    all other systems are negative except as noted in the HPI and PMH.    Physical Exam Updated Vital Signs BP 128/84 (BP Location: Left Arm)   Pulse 65   Temp 97.8 F (36.6 C) (Oral)   Resp 18   Ht 5\' 10"  (1.778 m)   Wt 93.9 kg (207 lb)  SpO2 100%   BMI 29.70 kg/m   Physical Exam  Constitutional: He is oriented to person, place, and time. He appears well-developed and well-nourished. No distress.  HENT:  Head: Normocephalic and atraumatic.  Mouth/Throat: Oropharynx is clear and moist. No oropharyngeal exudate.  Eyes: Pupils are equal, round, and reactive to light. Conjunctivae and EOM are normal.  Neck: Normal range of motion. Neck supple.  No meningismus.  Cardiovascular: Normal rate, regular rhythm, normal heart sounds and intact distal  pulses.   No murmur heard. Pulmonary/Chest: Effort normal and breath sounds normal. No respiratory distress.  Abdominal: Soft. There is no tenderness. There is no rebound and no guarding.  Musculoskeletal: Normal range of motion. He exhibits no edema or tenderness.  Neurological: He is alert and oriented to person, place, and time. No cranial nerve deficit. He exhibits normal muscle tone. Coordination normal.    CN 2-12 intact, no ataxia on finger to nose, no nystagmus, 5/5 strength throughout, no pronator drift, Romberg negative, normal gait.   Skin: Skin is warm.  Psychiatric: He has a normal mood and affect. His behavior is normal.  Nursing note and vitals reviewed.    ED Treatments / Results  Labs (all labs ordered are listed, but only abnormal results are displayed) Labs Reviewed - No data to display  EKG  EKG Interpretation None       Radiology No results found.  Procedures Procedures (including critical care time)  Medications Ordered in ED Medications  ketorolac (TORADOL) 30 MG/ML injection 30 mg (not administered)  metoCLOPramide (REGLAN) injection 10 mg (not administered)  diphenhydrAMINE (BENADRYL) injection 25 mg (not administered)  SUMAtriptan (IMITREX) tablet 100 mg (not administered)  sodium chloride 0.9 % bolus 1,000 mL (not administered)     Initial Impression / Assessment and Plan / ED Course  I have reviewed the triage vital signs and the nursing notes.  Pertinent labs & imaging results that were available during my care of the patient were reviewed by me and considered in my medical decision making (see chart for details).    Diffuse headache gradually worsening, similar to previous migraines, associated with nausea and vomiting. No thunderclap onset.  Patient with nonfocal neurological exam. He'll be treated for migraine with Toradol, Reglan, Benadryl and Imitrex. He had a CT head on June 27 that was unremarkable. Also had MRI of C-spine July  19 that showed minimal degenerative disc disease.  Patient feels improved after treatment in the ED. Low suspicion for subarachnoid hemorrhage, meningitis, temporal arteritis.  Patient is tolerating by mouth and ambulatory. He is requesting discharge. He appears stable for follow-up with PCP and neurology. Return precautions discussed.  Final Clinical Impressions(s) / ED Diagnoses   Final diagnoses:  Migraine without aura and without status migrainosus, not intractable    New Prescriptions New Prescriptions   No medications on file     Glynn Octaveancour, Charlottie Peragine, MD 12/03/16 [phone]

## 2016-12-06 DIAGNOSIS — M5412 Radiculopathy, cervical region: Secondary | ICD-10-CM | POA: Diagnosis not present

## 2016-12-09 ENCOUNTER — Telehealth: Payer: Self-pay | Admitting: Neurology

## 2016-12-09 DIAGNOSIS — G43709 Chronic migraine without aura, not intractable, without status migrainosus: Secondary | ICD-10-CM | POA: Diagnosis not present

## 2016-12-09 DIAGNOSIS — E6609 Other obesity due to excess calories: Secondary | ICD-10-CM | POA: Diagnosis not present

## 2016-12-09 DIAGNOSIS — Z1389 Encounter for screening for other disorder: Secondary | ICD-10-CM | POA: Diagnosis not present

## 2016-12-09 DIAGNOSIS — Z6830 Body mass index (BMI) 30.0-30.9, adult: Secondary | ICD-10-CM | POA: Diagnosis not present

## 2016-12-09 NOTE — Telephone Encounter (Signed)
Spoke with Pt, advsd him I spoke w/Alesa Ronne BinningWray from his company on 12/08/16-the birthdate was incorrect on the paperwork we rcvd for FMLA-advsd Pt we do not have to see him again to fill out FMLA. Advsd of $25 fee and we will conrtact him when ready.

## 2016-12-09 NOTE — Telephone Encounter (Signed)
PT called and said his work told him he has to be seen by Dr Everlena CooperJaffe in order to have the FMLA paperwork filled out, please call PT and advise

## 2017-01-11 DIAGNOSIS — M79603 Pain in arm, unspecified: Secondary | ICD-10-CM | POA: Insufficient documentation

## 2017-02-26 ENCOUNTER — Other Ambulatory Visit: Payer: Self-pay | Admitting: Neurology

## 2017-02-27 ENCOUNTER — Other Ambulatory Visit: Payer: Self-pay | Admitting: *Deleted

## 2017-02-27 MED ORDER — TOPIRAMATE 50 MG PO TABS: 100.0000 mg | ORAL_TABLET | Freq: Every day | ORAL | 2 refills | Status: DC

## 2017-03-14 ENCOUNTER — Ambulatory Visit: Payer: BLUE CROSS/BLUE SHIELD | Admitting: Neurology

## 2017-03-14 ENCOUNTER — Encounter: Payer: Self-pay | Admitting: Neurology

## 2017-03-14 VITALS — BP 126/90 | HR 78 | Ht 70.5 in | Wt 198.6 lb

## 2017-03-14 DIAGNOSIS — G43709 Chronic migraine without aura, not intractable, without status migrainosus: Secondary | ICD-10-CM | POA: Diagnosis not present

## 2017-03-14 DIAGNOSIS — M79641 Pain in right hand: Secondary | ICD-10-CM

## 2017-03-14 DIAGNOSIS — M79642 Pain in left hand: Secondary | ICD-10-CM | POA: Diagnosis not present

## 2017-03-14 DIAGNOSIS — F17200 Nicotine dependence, unspecified, uncomplicated: Secondary | ICD-10-CM | POA: Diagnosis not present

## 2017-03-14 DIAGNOSIS — G4733 Obstructive sleep apnea (adult) (pediatric): Secondary | ICD-10-CM | POA: Diagnosis not present

## 2017-03-14 MED ORDER — GABAPENTIN 100 MG PO CAPS: ORAL_CAPSULE | ORAL | 0 refills | Status: DC

## 2017-03-14 NOTE — Patient Instructions (Addendum)
1.  Restart gabapentin at 100mg  capsules.  Take 1 pill twice daily for 7 days, then 2 pills twice daily for 7 days, then 3 pills twice daily 2.  Start exercising at least 30 minutes 4 days a week 3.  Stop Dr. Reino KentPepper.  Instead drink water 4.  We will send you to a sleep specialist 5.  I advise that you quit smoking 6.  Follow up in 3 months

## 2017-03-14 NOTE — Progress Notes (Signed)
NEUROLOGY FOLLOW UP OFFICE NOTE  Dylan Shelton [id number]  HISTORY OF PRESENT ILLNESS: Dylan Shelton is a 41 year old right-handed male with OSA who follows up for chronic migraine.  He is accompanied by his wife who supplements history.  UPDATE: Intensity:  severe Duration:  Several hours Frequency:  daily Current NSAIDS:  ibuprofen Current analgesics:  No Current triptans:  sumatriptan 100mg  (waits to take it) Current anti-emetic:  no Current muscle relaxants:  no Current anti-anxiolytic:  no Current sleep aide:  no Current Antihypertensive medications:  no Current Antidepressant medications:  escitalopram 20mg  Current Anticonvulsant medications:  topiramate 50mg  (100mg  caused possible stomach upset) Current Vitamins/Herbal/Supplements:  no Current Antihistamines/Decongestants:  no Other therapy:  no  Caffeine:  Dr. Reino KentPepper Alcohol:  no Smoker:  yes  Diet:  Does not hydrate Exercise:  no Depression/anxiety:  yes Sleep hygiene:  Poor.  Has uncontrolled OSA.  He has neck pain.  For several months he reports shooting pain from both hands radiating up the arms to the shoulders.  He also endorses numbness in the hands.  He saw orthopedics.  MRI of cervical spine from 11/24/16 was personally reviewed and revealed mild degenerative disc bulging at C4-5 through C6-7 with mild right C4 and C5 foraminal stenosis but no significant canal stenosis.  He reportedly had a NCV-EMG which showed maybe mild carpal tunnel syndrome in the left hand, as well as chronic radiculopathy.  Steroid shots in the carpal tunnel were ineffective.  He was placed on gabapentin 300mg  twice daily which helped the pain but he stopped because it may have caused GI upset.  HISTORY: Onset:  Around January 2018 Location:  right frontal Quality:  Mild is dull, severe is throbbing Initial Intensity:  Mild is 4-5/10; severe is 8-9/10 Aura:  no Prodrome:  no Postdrome:  no Associated symptoms:  Osmophobia, nausea.   No photophobia and phonophobia. He has not had specific visual disturbance with the headache, but he notes seeing spots in his vision and needs to blink in order to focus.  He had a formal eye exam which was reportedly normal.  He has not had any new worse headache of his life, waking up from sleep Initial Duration:  9-12 hours Initial Frequency:  Daily (mild occurs 15 days per month; severe occurs 15 days per month) Initial Frequency of abortive medication: daily Triggers/exacerbating factors:  Certain smells. Relieving factors:  no Activity:  aggravates   Past NSAIDS:  no Past analgesics:  Excedrin, Goody Past abortive triptans:  no Past muscle relaxants:  no Past anti-emetic:  no Past antihypertensive medications:  no Past antidepressant medications:  no Past anticonvulsant medications:  no Past vitamins/Herbal/Supplements:  no Other past therapies:  no   CT of head from 11/02/16 was personally reviewed and was normal.   Personal history of headache:  He had headaches as a child up through middle school. Family history of headache:  No  PAST MEDICAL HISTORY: Past Medical History:  Diagnosis Date  . High cholesterol   . MRSA (methicillin resistant Staphylococcus aureus)   . Recurrent boils   . Renal disorder   . Sleep apnea     MEDICATIONS: Current Outpatient Medications on File Prior to Visit  Medication Sig Dispense Refill  . escitalopram (LEXAPRO) 10 MG tablet TK 1 T PO D  11  . SUMAtriptan (IMITREX) 100 MG tablet Take 1 tablet earliest onset of migraine.  May repeat once in 2 hours if headache persists or recurs. 10 tablet  2  . topiramate (TOPAMAX) 50 MG tablet Take 2 tablets (100 mg total) by mouth at bedtime. 30 tablet 2   No current facility-administered medications on file prior to visit.     ALLERGIES: No Known Allergies  FAMILY HISTORY: Family History  Problem Relation Age of Onset  . Melanoma Father   . Cancer Paternal Grandmother     SOCIAL  HISTORY: Social History   Socioeconomic History  . Marital status: Married    Spouse name: Not on file  . Number of children: 4  . Years of education: 90  . Highest education level: Not on file  Social Needs  . Financial resource strain: Not on file  . Food insecurity - worry: Not on file  . Food insecurity - inability: Not on file  . Transportation needs - medical: Not on file  . Transportation needs - non-medical: Not on file  Occupational History    Employer: [employer]  Tobacco Use  . Smoking status: Current Every Day Smoker    Types: Cigarettes  . Smokeless tobacco: Never Used  Substance and Sexual Activity  . Alcohol use: Yes    Comment: occasionally  . Drug use: No  . Sexual activity: Not on file  Other Topics Concern  . Not on file  Social History Narrative   Lives with wife, 4 children, brother in Social worker and sister in Social worker.  Works at [employer].  Education: high school.    REVIEW OF SYSTEMS: Constitutional: No fevers, chills, or sweats, no generalized fatigue, change in appetite Eyes: No visual changes, double vision, eye pain Ear, nose and throat: No hearing loss, ear pain, nasal congestion, sore throat Cardiovascular: No chest pain, palpitations Respiratory:  No shortness of breath at rest or with exertion, wheezes GastrointestinaI: No nausea, vomiting, diarrhea, abdominal pain, fecal incontinence Genitourinary:  No dysuria, urinary retention or frequency Musculoskeletal:  No neck pain, back pain Integumentary: No rash, pruritus, skin lesions Neurological: as above Psychiatric: No depression, insomnia, anxiety Endocrine: No palpitations, fatigue, diaphoresis, mood swings, change in appetite, change in weight, increased thirst Hematologic/Lymphatic:  No purpura, petechiae. Allergic/Immunologic: no itchy/runny eyes, nasal congestion, recent allergic reactions, rashes  PHYSICAL EXAM: Vitals:   03/14/17 1416  BP: 126/90  Pulse: 78  SpO2: 97%    General: No acute distress.  Patient appears well-groomed.  normal body habitus. Head:  Normocephalic/atraumatic Eyes:  Fundi examined but not visualized Neck: supple, no paraspinal tenderness, full range of motion Heart:  Regular rate and rhythm Lungs:  Clear to auscultation bilaterally Back: No paraspinal tenderness Neurological Exam: alert and oriented to person, place, and time. Attention span and concentration intact, recent and remote memory intact, fund of knowledge intact.  Speech fluent and not dysarthric, language intact.  CN II-XII intact. Bulk and tone normal, muscle strength 5/5 throughout.  Sensation to light touch  intact.  Deep tendon reflexes 2+ throughout.  Finger to nose testing intact.  Gait normal  IMPRESSION: Chronic migraine Bilateral hand pain/numbness, clinically consistent with carpal tunnel syndrome OSA Tobacco use disorder  PLAN: 1.  Advised lifestyle modification:  Hydration, stop soda, start exercise, sleep hygiene, smoking cessation 2.  Refer to sleep medicine for evaluation and treatment of OSA 3.  Take sumatriptan at earliest onset of migraine (not to wait) 4.  Restart gabapentin but at a lower dose and slowly titrate from 100mg  twice times daily to 300mg  twice daily 5.  Follow up in 3 months.  [name], DO  CC: Assunta Found, MD

## 2017-05-17 DIAGNOSIS — Z1389 Encounter for screening for other disorder: Secondary | ICD-10-CM | POA: Diagnosis not present

## 2017-05-17 DIAGNOSIS — Z6829 Body mass index (BMI) 29.0-29.9, adult: Secondary | ICD-10-CM | POA: Diagnosis not present

## 2017-05-17 DIAGNOSIS — J0100 Acute maxillary sinusitis, unspecified: Secondary | ICD-10-CM | POA: Diagnosis not present

## 2017-05-17 DIAGNOSIS — F329 Major depressive disorder, single episode, unspecified: Secondary | ICD-10-CM | POA: Diagnosis not present

## 2017-05-17 DIAGNOSIS — R0981 Nasal congestion: Secondary | ICD-10-CM | POA: Diagnosis not present

## 2017-05-17 DIAGNOSIS — H6691 Otitis media, unspecified, right ear: Secondary | ICD-10-CM | POA: Diagnosis not present

## 2017-05-23 ENCOUNTER — Emergency Department (HOSPITAL_COMMUNITY)
Admission: EM | Admit: 2017-05-23 | Discharge: 2017-05-23 | Disposition: A | Discharge status: Home / Self Care | Payer: BLUE CROSS/BLUE SHIELD | Attending: Emergency Medicine | Admitting: Emergency Medicine

## 2017-05-23 ENCOUNTER — Other Ambulatory Visit: Payer: Self-pay

## 2017-05-23 ENCOUNTER — Encounter (HOSPITAL_COMMUNITY): Payer: Self-pay | Admitting: Emergency Medicine

## 2017-05-23 ENCOUNTER — Emergency Department (HOSPITAL_COMMUNITY): Payer: BLUE CROSS/BLUE SHIELD

## 2017-05-23 DIAGNOSIS — F17210 Nicotine dependence, cigarettes, uncomplicated: Secondary | ICD-10-CM | POA: Insufficient documentation

## 2017-05-23 DIAGNOSIS — Y929 Unspecified place or not applicable: Secondary | ICD-10-CM | POA: Diagnosis not present

## 2017-05-23 DIAGNOSIS — W540XXA Bitten by dog, initial encounter: Secondary | ICD-10-CM | POA: Insufficient documentation

## 2017-05-23 DIAGNOSIS — Z23 Encounter for immunization: Secondary | ICD-10-CM | POA: Diagnosis not present

## 2017-05-23 DIAGNOSIS — Y9389 Activity, other specified: Secondary | ICD-10-CM | POA: Insufficient documentation

## 2017-05-23 DIAGNOSIS — Z79899 Other long term (current) drug therapy: Secondary | ICD-10-CM | POA: Insufficient documentation

## 2017-05-23 DIAGNOSIS — S61451A Open bite of right hand, initial encounter: Secondary | ICD-10-CM | POA: Diagnosis not present

## 2017-05-23 DIAGNOSIS — Y999 Unspecified external cause status: Secondary | ICD-10-CM | POA: Insufficient documentation

## 2017-05-23 DIAGNOSIS — M79641 Pain in right hand: Secondary | ICD-10-CM | POA: Diagnosis not present

## 2017-05-23 MED ORDER — HYDROCODONE-ACETAMINOPHEN 5-325 MG PO TABS: 1.0000 | ORAL_TABLET | ORAL | 0 refills | Status: DC | PRN

## 2017-05-23 MED ORDER — BACITRACIN-NEOMYCIN-POLYMYXIN 400-5-5000 EX OINT
TOPICAL_OINTMENT | Freq: Once | CUTANEOUS | Status: AC
  Administered 2017-05-23: 2 via TOPICAL
  Filled 2017-05-23: qty 2

## 2017-05-23 MED ORDER — IBUPROFEN 800 MG PO TABS
800.0000 mg | ORAL_TABLET | Freq: Once | ORAL | Status: AC
  Administered 2017-05-23: 800 mg via ORAL
  Filled 2017-05-23: qty 1

## 2017-05-23 MED ORDER — ACETAMINOPHEN 325 MG PO TABS
650.0000 mg | ORAL_TABLET | Freq: Once | ORAL | Status: AC
  Administered 2017-05-23: 650 mg via ORAL
  Filled 2017-05-23: qty 2

## 2017-05-23 MED ORDER — ONDANSETRON HCL 4 MG PO TABS
4.0000 mg | ORAL_TABLET | Freq: Once | ORAL | Status: AC
  Administered 2017-05-23: 4 mg via ORAL
  Filled 2017-05-23: qty 1

## 2017-05-23 MED ORDER — AMOXICILLIN-POT CLAVULANATE 875-125 MG PO TABS
1.0000 | ORAL_TABLET | Freq: Once | ORAL | Status: AC
  Administered 2017-05-23: 1 via ORAL
  Filled 2017-05-23: qty 1

## 2017-05-23 MED ORDER — AMOXICILLIN-POT CLAVULANATE 875-125 MG PO TABS: 1.0000 | ORAL_TABLET | Freq: Two times a day (BID) | ORAL | 0 refills | Status: DC

## 2017-05-23 MED ORDER — TETANUS-DIPHTH-ACELL PERTUSSIS 5-2.5-18.5 LF-MCG/0.5 IM SUSP
0.5000 mL | Freq: Once | INTRAMUSCULAR | Status: AC
  Administered 2017-05-23: 0.5 mL via INTRAMUSCULAR
  Filled 2017-05-23: qty 0.5

## 2017-05-23 NOTE — Discharge Instructions (Signed)
Please cleanse your wounds with soap and water daily.  Apply a bandage daily until the wound heals.  Please use Augmentin 2 times daily with a meal.  Use Tylenol every 4 hours or ibuprofen every 6 hours for soreness.  May use Norco for more severe pain or soreness.  Please keep the wound clean and dry.  Please see Dr.Golding for additional evaluation if you have any red streaks going up your arm, pus like drainage from the wound, or redness and fever indicating advancing infection.  Animal control will monitor your dog.  If rabies is found, please return to the emergency department for a series of rabies shots.  Your tetanus status was updated today.  Please notify your doctor so that it can become a part of your medical records.

## 2017-05-23 NOTE — ED Provider Notes (Signed)
[facility] EMERGENCY DEPARTMENT Provider Note   CSN: [id number] Arrival date & time: 05/23/17  1741     History   Chief Complaint Chief Complaint  Patient presents with  . Animal Bite    HPI Dylan Shelton is a 42 y.o. male.  Patient is a 42 year old male who presents to the emergency department with a complaint of dog bite to the right hand.  Patient states that he was breaking up a fight between his dog and another dog and his dog bit him on the right hand.  He says immediately he cleanse the wound with water and then applied peroxide.  He controlled the bleeding by applying pressure.  He states he can move all of his fingers without problem and he presents to the emergency department now for assistance with this issue.  He has not notified animal control.  He states that his dog is not up-to-date on immunizations.  And he does not recall the date of his last tetanus shot.      Past Medical History:  Diagnosis Date  . High cholesterol   . MRSA (methicillin resistant Staphylococcus aureus)   . Recurrent boils   . Renal disorder   . Sleep apnea     Patient Active Problem List   Diagnosis Date Noted  . OBSTRUCTIVE SLEEP APNEA 10/03/2007    History reviewed. No pertinent surgical history.     Home Medications    Prior to Admission medications   Medication Sig Start Date End Date Taking? Authorizing Provider  escitalopram (LEXAPRO) 10 MG tablet TK 1 T PO D 09/12/16   [provider]  gabapentin (NEURONTIN) 100 MG capsule Take 1cap BID for 7 days, then 2 caps BID for 7 days, then 3 caps BID 03/14/17   Jaffe, Adam R, DO  SUMAtriptan (IMITREX) 100 MG tablet Take 1 tablet earliest onset of migraine.  May repeat once in 2 hours if headache persists or recurs. 11/30/16   Drema Dallas, DO  topiramate (TOPAMAX) 50 MG tablet Take 2 tablets (100 mg total) by mouth at bedtime. 02/27/17   Drema Dallas, DO    Family History Family History  Problem Relation Age of Onset   . Melanoma Father   . Cancer Paternal Grandmother     Social History Social History   Tobacco Use  . Smoking status: Current Every Day Smoker    Types: Cigarettes  . Smokeless tobacco: Never Used  Substance Use Topics  . Alcohol use: Yes    Comment: occasionally  . Drug use: No     Allergies   Patient has no known allergies.   Review of Systems Review of Systems  Constitutional: Negative for activity change.       All ROS Neg except as noted in HPI  HENT: Negative for nosebleeds.   Eyes: Negative for photophobia and discharge.  Respiratory: Negative for cough, shortness of breath and wheezing.   Cardiovascular: Negative for chest pain and palpitations.  Gastrointestinal: Negative for abdominal pain and blood in stool.  Genitourinary: Negative for dysuria, frequency and hematuria.  Musculoskeletal: Negative for arthralgias, back pain and neck pain.       Hand pain  Skin: Negative.   Neurological: Negative for dizziness, seizures and speech difficulty.  Psychiatric/Behavioral: Negative for confusion and hallucinations.     Physical Exam Updated Vital Signs BP 135/90 (BP Location: Right Arm)   Pulse 63   Temp 98.6 F (37 C) (Oral)   Resp 18  Ht 5\' 9"  (1.753 m)   Wt 93 kg (205 lb)   SpO2 100%   BMI 30.27 kg/m   Physical Exam  Constitutional: He is oriented to person, place, and time. He appears well-developed and well-nourished.  Non-toxic appearance.  HENT:  Head: Normocephalic.  Right Ear: Tympanic membrane and external ear normal.  Left Ear: Tympanic membrane and external ear normal.  Eyes: EOM and lids are normal. Pupils are equal, round, and reactive to light.  Neck: Normal range of motion. Neck supple. Carotid bruit is not present.  Cardiovascular: Normal rate, regular rhythm, normal heart sounds, intact distal pulses and normal pulses.  Pulmonary/Chest: Breath sounds normal. No respiratory distress.  Abdominal: Soft. Bowel sounds are normal. There  is no tenderness. There is no guarding.  Musculoskeletal: Normal range of motion.       Right hand: He exhibits laceration. He exhibits normal range of motion, no tenderness and no deformity. Normal sensation noted. Normal strength noted.       Hands: Lymphadenopathy:       Head (right side): No submandibular adenopathy present.       Head (left side): No submandibular adenopathy present.    He has no cervical adenopathy.  Neurological: He is alert and oriented to person, place, and time. He has normal strength. No cranial nerve deficit or sensory deficit.  Skin: Skin is warm and dry.  Psychiatric: He has a normal mood and affect. His speech is normal.  Nursing note and vitals reviewed.    ED Treatments / Results  Labs (all labs ordered are listed, but only abnormal results are displayed) Labs Reviewed - No data to display  EKG  EKG Interpretation None       Radiology Dg Hand Complete Right  Result Date: 05/23/2017 CLINICAL DATA:  Right hand pain and swelling puncture wound EXAM: RIGHT HAND - COMPLETE 3+ VIEW COMPARISON:  None. FINDINGS: There is no evidence of fracture or dislocation. There is no evidence of arthropathy or other focal bone abnormality. Soft tissues are unremarkable. IMPRESSION: Negative. Electronically Signed   By: Jasmine Pang M.D.   On: 05/23/2017 20:22    Procedures Procedures (including critical care time)  Medications Ordered in ED Medications  Tdap (BOOSTRIX) injection 0.5 mL (not administered)  neomycin-bacitracin-polymyxin (NEOSPORIN) ointment (not administered)  amoxicillin-clavulanate (AUGMENTIN) 875-125 MG per tablet 1 tablet (1 tablet Oral Given 05/23/17 2043)  ondansetron (ZOFRAN) tablet 4 mg (4 mg Oral Given 05/23/17 2043)  ibuprofen (ADVIL,MOTRIN) tablet 800 mg (800 mg Oral Given 05/23/17 2044)  acetaminophen (TYLENOL) tablet 650 mg (650 mg Oral Given 05/23/17 2044)     Initial Impression / Assessment and Plan / ED Course  I have reviewed  the triage vital signs and the nursing notes.  Pertinent labs & imaging results that were available during my care of the patient were reviewed by me and considered in my medical decision making (see chart for details).       Final Clinical Impressions(s) / ED Diagnoses MDM The Police officers came to the patient's room to take a report.  They will observe the dog.  X-ray of the right hand is negative for fracture, dislocation, or foreign body.  No gross neurovascular deficits appreciated.  The patient will be treated with Augmentin.  Patient will use Tylenol, or ibuprofen for soreness.  I discussed with the patient the importance of cleansing the wounds daily with soap and water and applying a dressing daily.  The patient will return to the emergency  department if any problems with rabies noted on the dog's examination.  Patient is in agreement with this plan.   Final diagnoses:  Dog bite, initial encounter    ED Discharge Orders    None       Ivery QualeBryant, Zully Frane, Cordelia Poche-C 05/23/17 2117    Benjiman CorePickering, Nathan, MD 05/23/17 [phone]

## 2017-05-23 NOTE — ED Triage Notes (Addendum)
Pt was trying to stop a Engineer, agriculturaldog fight. Dog bit his right hand. Animal control was not called and per patient, the dog is not up to date on shots.  Wound to right hand.  Bleeding controlled.

## 2017-05-23 NOTE — ED Notes (Signed)
This RN contacted animal control at [phone] and spoke with Dionisio PaschalHunter Gauldin.

## 2017-06-14 ENCOUNTER — Encounter: Payer: Self-pay | Admitting: Neurology

## 2017-06-14 ENCOUNTER — Ambulatory Visit: Payer: BLUE CROSS/BLUE SHIELD | Admitting: Neurology

## 2017-06-14 VITALS — BP 110/72 | HR 69 | Ht 69.5 in | Wt 197.4 lb

## 2017-06-14 DIAGNOSIS — G43009 Migraine without aura, not intractable, without status migrainosus: Secondary | ICD-10-CM

## 2017-06-14 DIAGNOSIS — G4733 Obstructive sleep apnea (adult) (pediatric): Secondary | ICD-10-CM | POA: Diagnosis not present

## 2017-06-14 DIAGNOSIS — F17200 Nicotine dependence, unspecified, uncomplicated: Secondary | ICD-10-CM | POA: Diagnosis not present

## 2017-06-14 MED ORDER — TOPIRAMATE 100 MG PO TABS: 200.0000 mg | ORAL_TABLET | Freq: Every day | ORAL | 3 refills | Status: DC

## 2017-06-14 MED ORDER — GABAPENTIN 100 MG PO CAPS: 200.0000 mg | ORAL_CAPSULE | Freq: Every day | ORAL | 5 refills | Status: DC

## 2017-06-14 NOTE — Patient Instructions (Signed)
1.  Continue gabapentin 200mg  daily and topiramate 100mg  daily 2.  Use sumatriptan 100mg  for acute headaches, limited to no more than 2 days out of the week 3.  Quit smoking, increase exercise, try to stop caffeine intake 4.  Follow up in 5 months.

## 2017-06-14 NOTE — Progress Notes (Signed)
NEUROLOGY FOLLOW UP OFFICE NOTE  Dylan Shelton [id number]  HISTORY OF PRESENT ILLNESS: Dylan Shelton is a 42 year old right-handed male with OSA who follows up for chronic migraine. Marland Kitchen   UPDATE: Intensity:  Moderate to severe; November: severe Duration:  2 hours; November: Several hours Frequency:  1 to 2 days a week; November: daily Current NSAIDS:  No Current analgesics:  No Current triptans:  sumatriptan 100mg  Current anti-emetic:  no Current muscle relaxants:  no Current anti-anxiolytic:  no Current sleep aide:  no Current Antihypertensive medications:  no Current Antidepressant medications:  escitalopram 10mg  Current Anticonvulsant medications:  gabapentin 200mg  daily (higher doses caused stomach upset, topiramate 100mg  Current Vitamins/Herbal/Supplements:  no Current Antihistamines/Decongestants:  no Other therapy:  no   Caffeine:  Dr. Reino Kent Alcohol:  no Smoker:  yes  Diet:  Does not hydrate Exercise:  walks Depression:  no; Anxiety:  yes Other pain:  neck pain better Sleep hygiene:  Poor.  Has uncontrolled OSA.  Last visit, he was referred to a sleep specialist but never followed up to make appointment.    HISTORY: Onset:  Around January 2018 Location:  right frontal Quality:  Mild is dull, severe is throbbing Initial Intensity:  Mild is 4-5/10; severe is 8-9/10 Aura:  no Prodrome:  no Postdrome:  no Associated symptoms:  Osmophobia, nausea.  No photophobia and phonophobia. He has not had specific visual disturbance with the headache, but he notes seeing spots in his vision and needs to blink in order to focus.  He had a formal eye exam which was reportedly normal.  He has not had any new worse headache of his life, waking up from sleep Initial Duration:  9-12 hours Initial Frequency:  Daily (mild occurs 15 days per month; severe occurs 15 days per month) Initial Frequency of abortive medication: daily Triggers/exacerbating factors:  Certain smells. Relieving  factors:  no Activity:  aggravates   Past NSAIDS:  ibuprofen Past analgesics:  Excedrin, Goody Past abortive triptans:  no Past muscle relaxants:  no Past anti-emetic:  no Past antihypertensive medications:  no Past antidepressant medications:  no Past anticonvulsant medications:  no Past vitamins/Herbal/Supplements:  no Other past therapies:  no   CT of head from 11/02/16 was personally reviewed and was normal.   Personal history of headache:  He had headaches as a child up through middle school. Family history of headache:  No  He has neck pain.  Since last year, he reports shooting pain from both hands radiating up the arms to the shoulders.  He also endorses numbness in the hands.  He saw orthopedics.  MRI of cervical spine from 11/24/16 was personally reviewed and revealed mild degenerative disc bulging at C4-5 through C6-7 with mild right C4 and C5 foraminal stenosis but no significant canal stenosis.  He reportedly had a NCV-EMG which showed maybe mild carpal tunnel syndrome in the left hand, as well as chronic radiculopathy.  Steroid shots in the carpal tunnel were ineffective.  He was placed on gabapentin 300mg  twice daily which helped the pain but he stopped because it may have caused GI upset.  PAST MEDICAL HISTORY: Past Medical History:  Diagnosis Date  . High cholesterol   . MRSA (methicillin resistant Staphylococcus aureus)   . Recurrent boils   . Renal disorder   . Sleep apnea     MEDICATIONS: Current Outpatient Medications on File Prior to Visit  Medication Sig Dispense Refill  . amoxicillin-clavulanate (AUGMENTIN) 875-125 MG tablet Take  1 tablet by mouth every 12 (twelve) hours. (Patient not taking: Reported on 06/14/2017) 14 tablet 0  . escitalopram (LEXAPRO) 10 MG tablet TK 1 T PO D  11  . HYDROcodone-acetaminophen (NORCO/VICODIN) 5-325 MG tablet Take 1 tablet by mouth every 4 (four) hours as needed. (Patient not taking: Reported on 06/14/2017) 10 tablet 0  .  SUMAtriptan (IMITREX) 100 MG tablet Take 1 tablet earliest onset of migraine.  May repeat once in 2 hours if headache persists or recurs. 10 tablet 2  . topiramate (TOPAMAX) 50 MG tablet Take 2 tablets (100 mg total) by mouth at bedtime. 30 tablet 2   No current facility-administered medications on file prior to visit.     ALLERGIES: No Known Allergies  FAMILY HISTORY: Family History  Problem Relation Age of Onset  . Melanoma Father   . Cancer Paternal Grandmother     SOCIAL HISTORY: Social History   Socioeconomic History  . Marital status: Married    Spouse name: Not on file  . Number of children: 4  . Years of education: 4012  . Highest education level: Not on file  Social Needs  . Financial resource strain: Not on file  . Food insecurity - worry: Not on file  . Food insecurity - inability: Not on file  . Transportation needs - medical: Not on file  . Transportation needs - non-medical: Not on file  Occupational History    Employer: [employer]  Tobacco Use  . Smoking status: Current Every Day Smoker    Types: Cigarettes  . Smokeless tobacco: Never Used  Substance and Sexual Activity  . Alcohol use: Yes    Comment: occasionally  . Drug use: No  . Sexual activity: Not on file  Other Topics Concern  . Not on file  Social History Narrative   Lives with wife, 4 children, brother in Social workerlaw and sister in Social workerlaw.  Works at [employer].  Education: high school.    REVIEW OF SYSTEMS: Constitutional: No fevers, chills, or sweats, no generalized fatigue, change in appetite Eyes: No visual changes, double vision, eye pain Ear, nose and throat: No hearing loss, ear pain, nasal congestion, sore throat Cardiovascular: No chest pain, palpitations Respiratory:  No shortness of breath at rest or with exertion, wheezes GastrointestinaI: No nausea, vomiting, diarrhea, abdominal pain, fecal incontinence Genitourinary:  No dysuria, urinary retention or  frequency Musculoskeletal:  No neck pain, back pain Integumentary: No rash, pruritus, skin lesions Neurological: as above Psychiatric: No depression, insomnia, anxiety Endocrine: No palpitations, fatigue, diaphoresis, mood swings, change in appetite, change in weight, increased thirst Hematologic/Lymphatic:  No purpura, petechiae. Allergic/Immunologic: no itchy/runny eyes, nasal congestion, recent allergic reactions, rashes  PHYSICAL EXAM: Vitals:   06/14/17 0747  BP: 110/72  Pulse: 69  SpO2: 98%   General: No acute distress.  Patient appears well-groomed.  Head:  Normocephalic/atraumatic Eyes:  Fundi examined but not visualized Neck: supple, no paraspinal tenderness, full range of motion Heart:  Regular rate and rhythm Lungs:  Clear to auscultation bilaterally Back: No paraspinal tenderness Neurological Exam: alert and oriented to person, place, and time. Attention span and concentration intact, recent and remote memory intact, fund of knowledge intact.  Speech fluent and not dysarthric, language intact.  CN II-XII intact. Bulk and tone normal, muscle strength 5/5 throughout.  Sensation to light touch  intact.  Deep tendon reflexes 2+ throughout.  Finger to nose testing intact.  Gait normal, Romberg negative.  IMPRESSION: 1.  Migraine without aura, without status migrainosus,  not intractable.  Migraines improved.  No longer chronic. 2.  Tobacco use disorder 3.  OSA  PLAN: 1.  Continue gabapentin 200mg  daily and topiramate 100mg  daily 2.  Use sumatriptan 100mg  for acute headaches, limited to no more than 2 days out of the week to prevent rebound headache 3.  Smoking cessation instruction/counseling given:  counseled patient on the dangers of tobacco use, advised patient to stop smoking, and reviewed strategies to maximize success. 4.  Increase exercise, caffeine cessation, headache diary 5.  Follow up with sleep medicine  6.  Follow up in 5 months.  [name], DO  CC: Assunta Found, MD

## 2017-09-04 ENCOUNTER — Encounter: Payer: Self-pay | Admitting: Neurology

## 2017-10-24 ENCOUNTER — Other Ambulatory Visit: Payer: Self-pay | Admitting: Neurology

## 2017-11-02 DIAGNOSIS — E663 Overweight: Secondary | ICD-10-CM | POA: Diagnosis not present

## 2017-11-02 DIAGNOSIS — Z1389 Encounter for screening for other disorder: Secondary | ICD-10-CM | POA: Diagnosis not present

## 2017-11-02 DIAGNOSIS — Z6829 Body mass index (BMI) 29.0-29.9, adult: Secondary | ICD-10-CM | POA: Diagnosis not present

## 2017-11-02 DIAGNOSIS — Z125 Encounter for screening for malignant neoplasm of prostate: Secondary | ICD-10-CM | POA: Diagnosis not present

## 2017-11-02 DIAGNOSIS — B999 Unspecified infectious disease: Secondary | ICD-10-CM | POA: Diagnosis not present

## 2017-11-15 ENCOUNTER — Ambulatory Visit: Payer: BLUE CROSS/BLUE SHIELD | Admitting: Neurology

## 2017-11-15 ENCOUNTER — Encounter: Payer: Self-pay | Admitting: Neurology

## 2017-11-15 VITALS — BP 110/74 | HR 67 | Ht 70.0 in | Wt 198.0 lb

## 2017-11-15 DIAGNOSIS — F17200 Nicotine dependence, unspecified, uncomplicated: Secondary | ICD-10-CM

## 2017-11-15 DIAGNOSIS — G43009 Migraine without aura, not intractable, without status migrainosus: Secondary | ICD-10-CM

## 2017-11-15 DIAGNOSIS — G4733 Obstructive sleep apnea (adult) (pediatric): Secondary | ICD-10-CM | POA: Diagnosis not present

## 2017-11-15 MED ORDER — NAPROXEN 500 MG PO TABS: 500.0000 mg | ORAL_TABLET | Freq: Two times a day (BID) | ORAL | 2 refills | Status: DC | PRN

## 2017-11-15 NOTE — Patient Instructions (Signed)
1.  Continue topiramate 100mg  at bedtime.  STOP gabapentin. 2.  Take sumatriptan at earliest onset of headache and may repeat once after 2 hours if needed (not to exceed 2 tablets in 24 hours).  You may take naproxen 500mg  with the sumatriptan.  3.  If you get a migraine at work, take just the naproxen 500mg .  May repeat in 12 hours as needed. 4.  Limit use of pain relievers to no more than 2 days out of week 5.  Please make appointment with sleep specialist 6.  Please try to quit smoking 7.  Follow up in 6 months.

## 2017-11-15 NOTE — Progress Notes (Signed)
NEUROLOGY FOLLOW UP OFFICE NOTE  Dylan Shelton [id number]  HISTORY OF PRESENT ILLNESS: Dylan Shelton is a 42 year old right-handed male with OSA who follows up for chronic migraine. Marland Kitchen   UPDATE: Intensity:  5-6/10 Duration:  2 hours with sumatriptan.  He does not treat headaches at work since sumatriptan makes him "feel funny"; February: 2 hours Frequency:  1 to 2 days a week; February: 1 to 2 days a week Current NSAIDS:  No Current analgesics:  No Current triptans:  sumatriptan 100mg  Current anti-emetic:  no Current muscle relaxants:  no Current anti-anxiolytic:  no Current sleep aide:  no Current Antihypertensive medications:  no Current Antidepressant medications:  escitalopram 10mg  Current Anticonvulsant medications:  gabapentin 200mg  daily (higher doses caused stomach upset, topiramate 100mg  Current Vitamins/Herbal/Supplements:  no Current Antihistamines/Decongestants:  no Other therapy:  no   Caffeine:  Dr. Reino Kent not daily Alcohol:  no Smoker:  yes  Diet:  Increased water intake Exercise:  walks Depression:  no; Anxiety:  No.  On Lexapro.  He has separated from his wife. Other pain:  neck pain better Sleep hygiene:  Poor.  Has uncontrolled OSA.  He still never made appointment with sleep specialist.     HISTORY: Onset:  Around January 2018 Location:  right frontal Quality:  Mild is dull, severe is throbbing Initial Intensity:  Mild is 4-5/10; severe is 8-9/10 Aura:  no Prodrome:  no Postdrome:  no Associated symptoms:  Osmophobia, nausea.  No photophobia and phonophobia. He has not had specific visual disturbance with the headache, but he notes seeing spots in his vision and needs to blink in order to focus.  He had a formal eye exam which was reportedly normal.  He has not had any new worse headache of his life, waking up from sleep Initial Duration:  9-12 hours Initial Frequency:  Daily (mild occurs 15 days per month; severe occurs 15 days per month) Initial  Frequency of abortive medication: daily Triggers/exacerbating factors:  Certain smells. Relieving factors:  no Activity:  aggravates   Past NSAIDS:  ibuprofen Past analgesics:  Excedrin, Goody Past abortive triptans:  no Past muscle relaxants:  no Past anti-emetic:  no Past antihypertensive medications:  no Past antidepressant medications:  no Past anticonvulsant medications:  no Past vitamins/Herbal/Supplements:  no Other past therapies:  no   CT of head from 11/02/16 was personally reviewed and was normal.   Personal history of headache:  He had headaches as a child up through middle school. Family history of headache:  No   He has neck pain.  Since 2018, he reports shooting pain from both hands radiating up the arms to the shoulders.  He also endorses numbness in the hands.  He saw orthopedics.  MRI of cervical spine from 11/24/16 was personally reviewed and revealed mild degenerative disc bulging at C4-5 through C6-7 with mild right C4 and C5 foraminal stenosis but no significant canal stenosis.  He reportedly had a NCV-EMG which showed maybe mild carpal tunnel syndrome in the left hand, as well as chronic radiculopathy.  Steroid shots in the carpal tunnel were ineffective.  He was placed on gabapentin 300mg  twice daily which helped the pain but he stopped because it may have caused GI upset.  PAST MEDICAL HISTORY: Past Medical History:  Diagnosis Date  . High cholesterol   . MRSA (methicillin resistant Staphylococcus aureus)   . Recurrent boils   . Renal disorder   . Sleep apnea  MEDICATIONS: Current Outpatient Medications on File Prior to Visit  Medication Sig Dispense Refill  . amoxicillin-clavulanate (AUGMENTIN) 875-125 MG tablet Take 1 tablet by mouth every 12 (twelve) hours. (Patient not taking: Reported on 06/14/2017) 14 tablet 0  . escitalopram (LEXAPRO) 10 MG tablet TK 1 T PO D  11  . HYDROcodone-acetaminophen (NORCO/VICODIN) 5-325 MG tablet Take 1 tablet by mouth  every 4 (four) hours as needed. (Patient not taking: Reported on 06/14/2017) 10 tablet 0  . SUMAtriptan (IMITREX) 100 MG tablet Take 1 tablet earliest onset of migraine.  May repeat once in 2 hours if headache persists or recurs. 10 tablet 2  . topiramate (TOPAMAX) 100 MG tablet Take 2 tablets (200 mg total) by mouth at bedtime. 60 tablet 3  . topiramate (TOPAMAX) 50 MG tablet Take 2 tablets (100 mg total) by mouth at bedtime. 30 tablet 2   No current facility-administered medications on file prior to visit.     ALLERGIES: No Known Allergies  FAMILY HISTORY: Family History  Problem Relation Age of Onset  . Melanoma Father   . Cancer Paternal Grandmother     SOCIAL HISTORY: Social History   Socioeconomic History  . Marital status: Married    Spouse name: Not on file  . Number of children: 4  . Years of education: 53  . Highest education level: Not on file  Occupational History    Employer: [employer]  Social Needs  . Financial resource strain: Not on file  . Food insecurity:    Worry: Not on file    Inability: Not on file  . Transportation needs:    Medical: Not on file    Non-medical: Not on file  Tobacco Use  . Smoking status: Current Every Day Smoker    Types: Cigarettes  . Smokeless tobacco: Never Used  Substance and Sexual Activity  . Alcohol use: Yes    Comment: occasionally  . Drug use: No  . Sexual activity: Not on file  Lifestyle  . Physical activity:    Days per week: Not on file    Minutes per session: Not on file  . Stress: Not on file  Relationships  . Social connections:    Talks on phone: Not on file    Gets together: Not on file    Attends religious service: Not on file    Active member of club or organization: Not on file    Attends meetings of clubs or organizations: Not on file    Relationship status: Not on file  . Intimate partner violence:    Fear of current or ex partner: Not on file    Emotionally abused: Not on file     Physically abused: Not on file    Forced sexual activity: Not on file  Other Topics Concern  . Not on file  Social History Narrative   Lives with wife, 4 children, brother in Social worker and sister in Social worker.  Works at [employer].  Education: high school.    REVIEW OF SYSTEMS: Constitutional: No fevers, chills, or sweats, no generalized fatigue, change in appetite Eyes: No visual changes, double vision, eye pain Ear, nose and throat: No hearing loss, ear pain, nasal congestion, sore throat Cardiovascular: No chest pain, palpitations Respiratory:  No shortness of breath at rest or with exertion, wheezes GastrointestinaI: No nausea, vomiting, diarrhea, abdominal pain, fecal incontinence Genitourinary:  No dysuria, urinary retention or frequency Musculoskeletal:  No neck pain, back pain Integumentary: No rash, pruritus, skin lesions Neurological:  as above Psychiatric: No depression, anxiety Endocrine: No palpitations, fatigue, diaphoresis, mood swings, change in appetite, change in weight, increased thirst Hematologic/Lymphatic:  No purpura, petechiae. Allergic/Immunologic: no itchy/runny eyes, nasal congestion, recent allergic reactions, rashes  PHYSICAL EXAM: Vitals:   11/15/17 0750  BP: 110/74  Pulse: 67  SpO2: 97%   General: No acute distress.  Patient appears well-groomed.   Head:  Normocephalic/atraumatic Eyes:  Fundi examined but not visualized Neck: supple, no paraspinal tenderness, full range of motion Heart:  Regular rate and rhythm Lungs:  Clear to auscultation bilaterally Back: No paraspinal tenderness Neurological Exam: alert and oriented to person, place, and time. Attention span and concentration intact, recent and remote memory intact, fund of knowledge intact.  Speech fluent and not dysarthric, language intact.  CN II-XII intact. Bulk and tone normal, muscle strength 5/5 throughout.  Sensation to light touch  intact.  Deep tendon reflexes 2+ throughout.  Finger to nose  testing intact.  Gait normal, Romberg negative.  IMPRESSION: Migraine without aura, without status migrainosus, not intractable OSA Tobacco use disorder  PLAN: 1.  Continue topiramate 100mg  at bedtime.  He will try discontinuing gabapentin as he may no longer need it. 2.  Continue sumatriptan for abortive therapy.  At work, will try treating with naproxen (hopefully no side effects).  Advised that he may take naproxen with sumatriptan as well 3.  Headache diary 4.  Limit use of pain relievers to no more than 2 days out of week to prevent rebound headache 5.  Advised to make appointment with sleep specialist 6.  Tobacco cessation counseling given 7.  Follow up in 6 months.  Shon MilletAdam Jaffe, DO  CC:  Dr. Phillips OdorGolding

## 2017-11-20 ENCOUNTER — Telehealth: Payer: Self-pay | Admitting: Neurology

## 2017-11-20 DIAGNOSIS — Z0279 Encounter for issue of other medical certificate: Secondary | ICD-10-CM

## 2017-11-20 NOTE — Telephone Encounter (Signed)
Lester KinsmanDavid Delman Self [phone]  Onalee HuaDavid dropped off FMLA forms to be filled out, will pay fee when he picks up forms. Placed in Dr Moises BloodJaffe's box

## 2017-11-21 NOTE — Telephone Encounter (Signed)
Called Pt and addvised FMLA paperwork is ready to be picked up

## 2017-12-16 IMAGING — MR MR CERVICAL SPINE W/O CM
4 of 5 series · 14 of 48 positions shown · non-contrast
Comparison: None available.

CLINICAL DATA: Initial evaluation for cervical radiculopathy,
bilateral arm pain with weakness and numbness.

EXAM:
MRI CERVICAL SPINE WITHOUT CONTRAST
TECHNIQUE: Multiplanar, multisequence MR imaging of the cervical spine was
performed. No intravenous contrast was administered.

[Series 3: T2 · sagittal · 3.0mm · 0.41mm/px · 5 of 13 slices shown (1 of 2)]
[im 1/13]
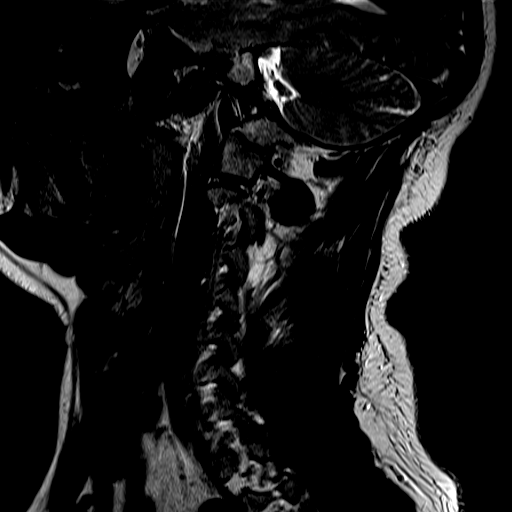
[im 4/13]
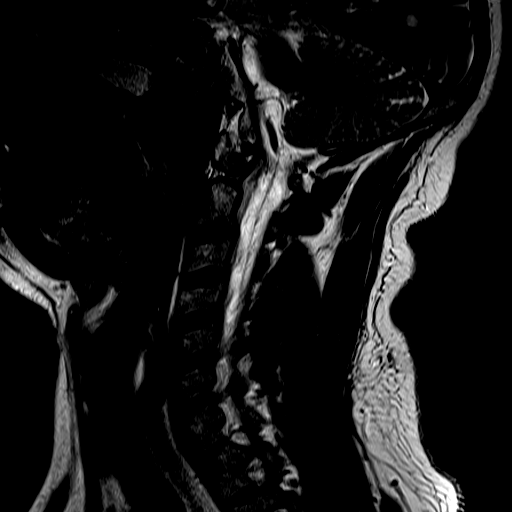
[im 7/13]
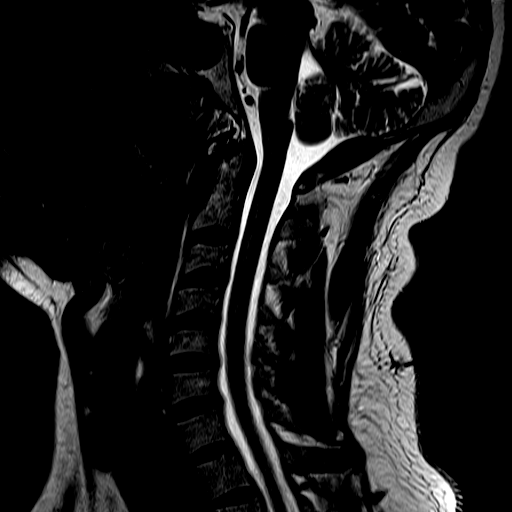
[im 10/13]
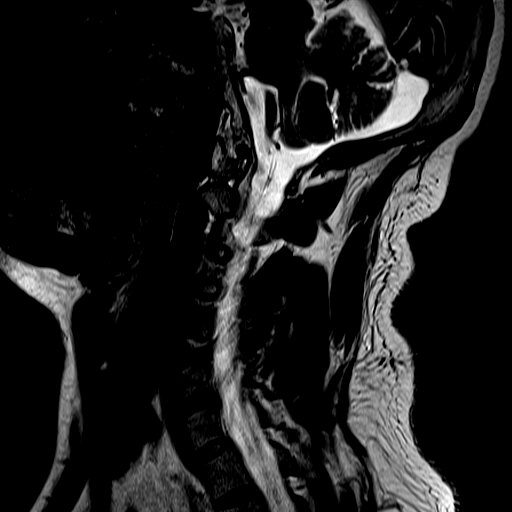
[im 13/13]
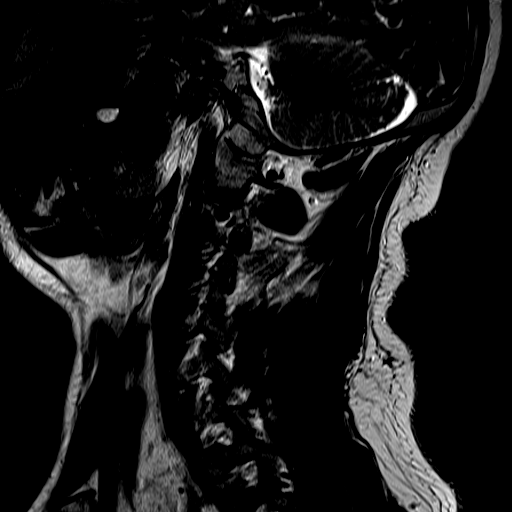

[Series 4: FLAIR · sagittal · 3.0mm · 0.46mm/px · 3 of 13 slices shown]
[im 1/13]
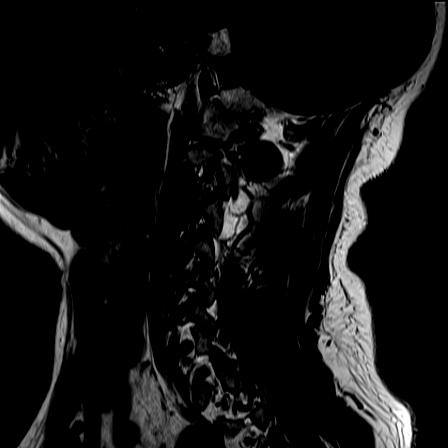
[im 7/13]
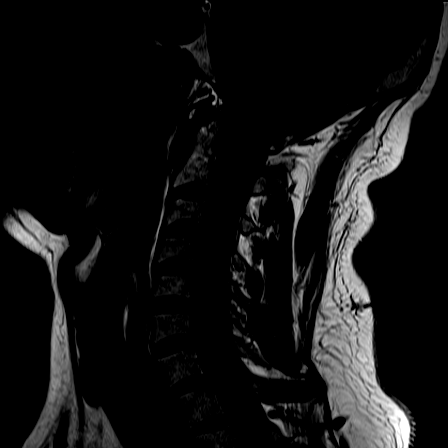
[im 13/13]
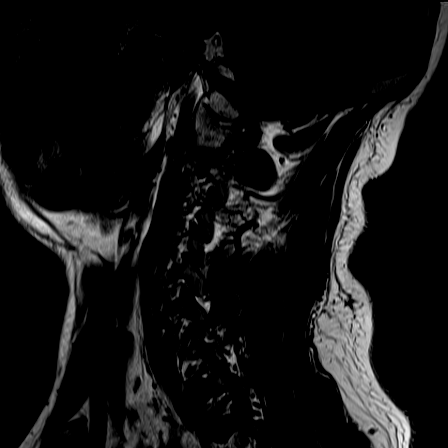

[Series 5: ir sagital · sagittal · 3.0mm · 0.23mm/px · 3 of 13 slices shown]
[im 3/13]
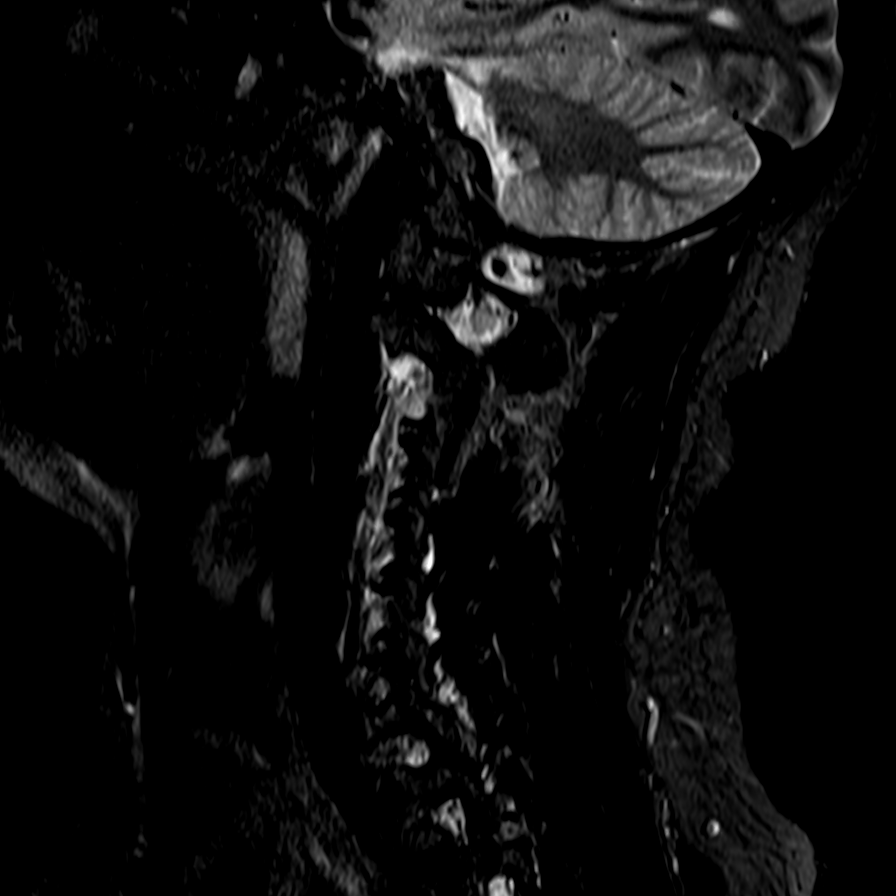
[im 8/13]
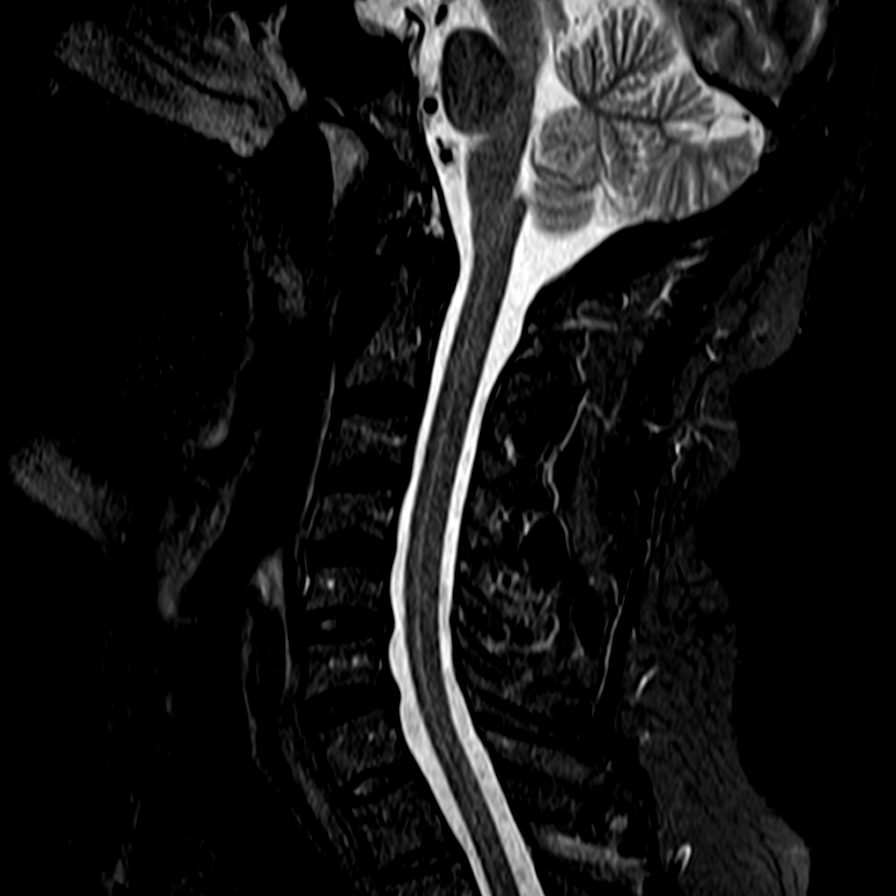
[im 13/13]
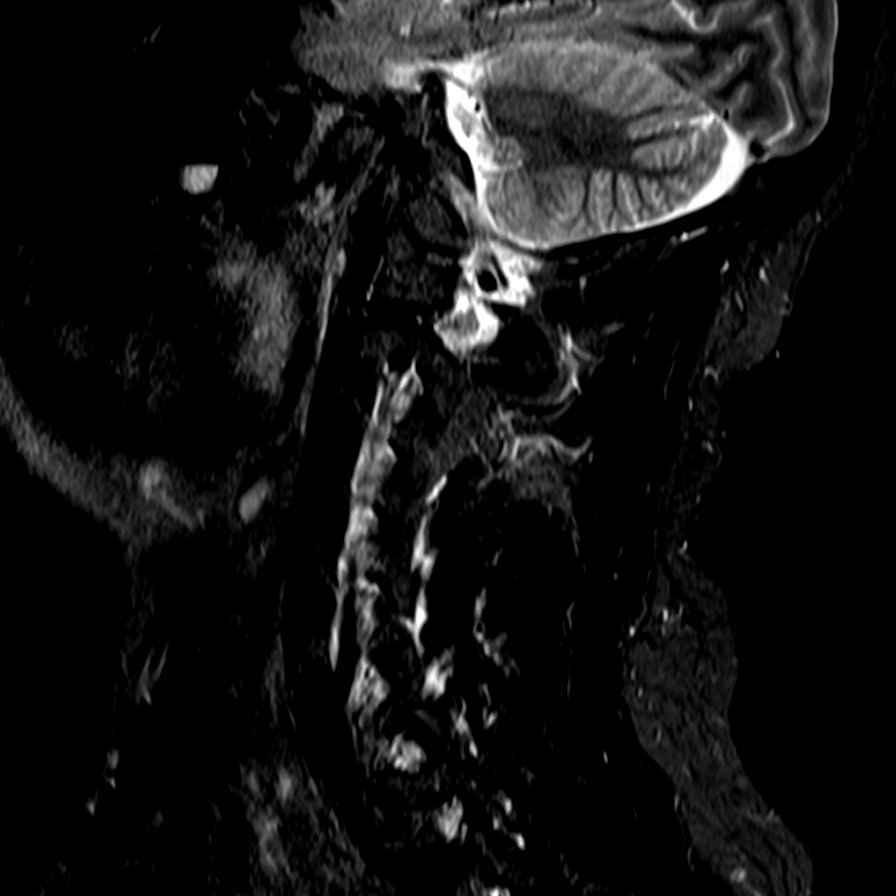

[Series 7: T2 · axial · 3.0mm · 0.19mm/px · z∈[-57,+27]mm · 3 of 36 slices shown (2 of 2)]
[im 5/36]
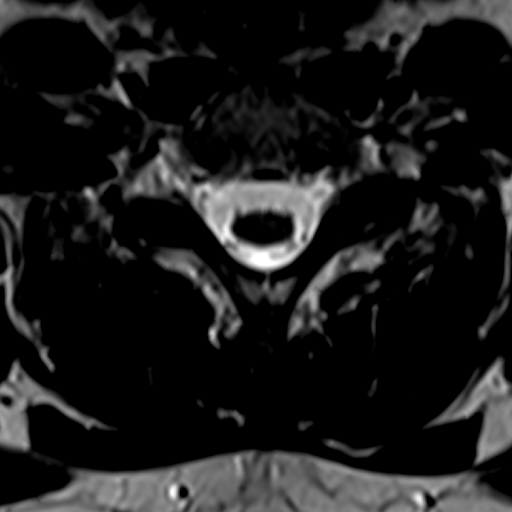
[im 19/36]
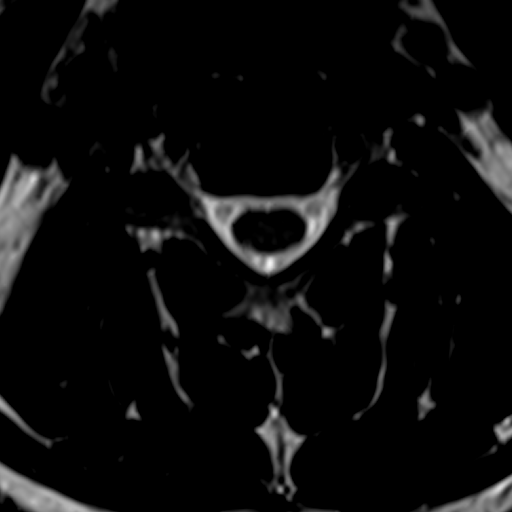
[im 31/36]
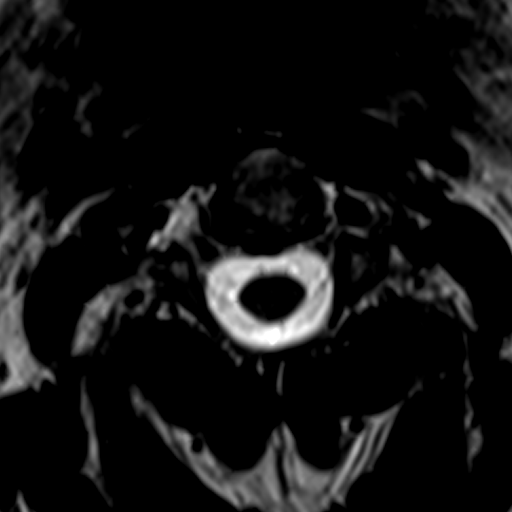

[14 of 48 positions shown; findings below may reference images not displayed]

FINDINGS: Alignment: Vertebral bodies normally aligned with preservation of
the normal cervical lordosis. No listhesis.

Vertebrae: Vertebral body heights are well maintained. No evidence
for acute or chronic fracture. Signal intensity within the vertebral
body bone marrow within normal limits. No discrete or worrisome
osseous lesions. No abnormal marrow edema.

Cord: Signal intensity within the cervical spinal cord is normal.

Posterior Fossa, vertebral arteries, paraspinal tissues: Visualized
brain and posterior fossa are within normal limits. Craniocervical
junction normal. Paraspinous and prevertebral soft tissues within
normal limits. Normal intravascular flow voids present within the
vertebral arteries bilaterally.

Disc levels:

C2-C3: Unremarkable.

C3-C4: Mild right-sided uncovertebral hypertrophy with resultant
mild right C4 foraminal stenosis. Otherwise unremarkable without
canal or left neural foraminal narrowing.

C4-C5: Mild disc bulge. No significant canal stenosis. Mild
right-sided uncovertebral disease with resultant mild right C5
foraminal stenosis. No significant left foraminal stenosis.

C5-C6: Small central disc protrusion indents the ventral thecal sac
(series 7, image 24). No significant canal stenosis. No foraminal
encroachment.

C6-C7: Broad posterior disc bulge with mild bilateral uncovertebral
hypertrophy. Bulging disc mildly flattens the ventral thecal sac
without significant canal or foraminal stenosis.

C7-T1:  Unremarkable.

Visualized upper thoracic spine within normal limits.
IMPRESSION: 1. Mild degenerative disc bulging at C4-5 through C6-7 without
significant canal stenosis.
2. Mild right C4 and C5 foraminal stenosis related to uncovertebral
disease. No other significant foraminal encroachment within the
cervical spine.

## 2018-01-05 ENCOUNTER — Other Ambulatory Visit: Payer: Self-pay | Admitting: Neurology

## 2018-01-05 DIAGNOSIS — G43009 Migraine without aura, not intractable, without status migrainosus: Secondary | ICD-10-CM

## 2018-03-07 ENCOUNTER — Other Ambulatory Visit: Payer: Self-pay | Admitting: Neurology

## 2018-04-10 ENCOUNTER — Other Ambulatory Visit: Payer: Self-pay | Admitting: Neurology

## 2018-04-19 DIAGNOSIS — Z1389 Encounter for screening for other disorder: Secondary | ICD-10-CM | POA: Diagnosis not present

## 2018-04-19 DIAGNOSIS — E663 Overweight: Secondary | ICD-10-CM | POA: Diagnosis not present

## 2018-04-19 DIAGNOSIS — Z6826 Body mass index (BMI) 26.0-26.9, adult: Secondary | ICD-10-CM | POA: Diagnosis not present

## 2018-04-19 DIAGNOSIS — F329 Major depressive disorder, single episode, unspecified: Secondary | ICD-10-CM | POA: Diagnosis not present

## 2018-04-19 DIAGNOSIS — F419 Anxiety disorder, unspecified: Secondary | ICD-10-CM | POA: Diagnosis not present

## 2018-04-23 NOTE — Progress Notes (Signed)
NEUROLOGY FOLLOW UP OFFICE NOTE  Dylan Shelton [id number]  HISTORY OF PRESENT ILLNESS: Dylan Shelton is a 42 year old right-handed male with OSA who follows up for migraine.  UPDATE: Still has mild dull headaches almost daily.  As for migrianes: Intensity:  5-6/10 Duration: 2 hours with sumatriptan Frequency: 1 day a week Frequency of abortive medication: Sumatriptan once a week.  Excedrin for dull headache several days a week.   Current NSAIDS: None Current analgesics: Excedrin. Current triptans: Sumatriptan 100 mg Current ergotamine: None Current anti-emetic: None Current muscle relaxants: None Current anti-anxiolytic: None Current sleep aide: None Current Antihypertensive medications: None Current Antidepressant medications: Lexapro 10 mg Current Anticonvulsant medications: Topiramate 100 mg, gabapentin 100 mg daily (higher doses caused stomach upset) Current anti-CGRP: None Current Vitamins/Herbal/Supplements: None Current Antihistamines/Decongestants: None Other therapy: None  Caffeine: Stopped Alcohol: No Smoker: Yes Diet: Increased water intake Exercise: Walks Depression: No; Anxiety: No Other pain: Neck pain better Sleep hygiene: Poor.  Has uncontrolled OSA.  HISTORY: Onset: Around January 2018 Location:  right frontal Quality:  Mild is dull, severe is throbbing Initial Intensity:  Mild is 4-5/10; severe is 8-9/10 Aura:  no Prodrome:  no Postdrome:  no Associated symptoms: Osmophobia, nausea.  No photophobia and phonophobia. He has not had specific visual disturbance with the headache, but he notes seeing spots in his vision and needs to blink in order to focus.  He had a formal eye exam which was reportedly normal.  He has not had any new worse headache of his life, waking up from sleep Initial Duration:  9-12 hours Initial Frequency:  Daily (mild occurs 15 days per month; severe occurs 15 days per month) Initial Frequency of abortive medication:  daily Triggers: Certain smells Relieving factors:  no Activity:  aggravates  Past NSAIDS:  ibuprofen, naproxen Past analgesics:  Excedrin, Goody Past abortive triptans:  no Past muscle relaxants:  no Past anti-emetic:  no Past antihypertensive medications:  no Past antidepressant medications:  no Past anticonvulsant medications:  no Past vitamins/Herbal/Supplements:  no Other past therapies:  no  CT of head from 11/02/16 was personally reviewed and was normal.  Personal history of headache:  He had headaches as a child up through middle school. Family history of headache:  No  He has neck pain.  Since 2018, he reports shooting pain from both hands radiating up the arms to the shoulders.  He also endorses numbness in the hands.  He saw orthopedics.  MRI of cervical spine from 11/24/16 was personally reviewed and revealed mild degenerative disc bulging at C4-5 through C6-7 with mild right C4 and C5 foraminal stenosis but no significant canal stenosis.  He reportedly had a NCV-EMG which showed maybe mild carpal tunnel syndrome in the left hand, as well as chronic radiculopathy.  Steroid shots in the carpal tunnel were ineffective.  He was placed on gabapentin 300mg  twice daily which helped the pain but he stopped because it may have caused GI upset.  PAST MEDICAL HISTORY: Past Medical History:  Diagnosis Date  . High cholesterol   . MRSA (methicillin resistant Staphylococcus aureus)   . Recurrent boils   . Renal disorder   . Sleep apnea     MEDICATIONS: Current Outpatient Medications on File Prior to Visit  Medication Sig Dispense Refill  . amoxicillin-clavulanate (AUGMENTIN) 875-125 MG tablet Take 1 tablet by mouth every 12 (twelve) hours. (Patient not taking: Reported on 06/14/2017) 14 tablet 0  . escitalopram (LEXAPRO) 10 MG tablet TK 1 T PO  D  11  . HYDROcodone-acetaminophen (NORCO/VICODIN) 5-325 MG tablet Take 1 tablet by mouth every 4 (four) hours as needed. (Patient not  taking: Reported on 06/14/2017) 10 tablet 0  . naproxen (NAPROSYN) 500 MG tablet Take 1 tablet (500 mg total) by mouth every 12 (twelve) hours as needed. 15 tablet 2  . SUMAtriptan (IMITREX) 100 MG tablet Take 1 tablet earliest onset of migraine.  May repeat once in 2 hours if headache persists or recurs. 10 tablet 2  . topiramate (TOPAMAX) 100 MG tablet TAKE 2 TABLETS(200 MG) BY MOUTH AT BEDTIME 60 tablet 3  . topiramate (TOPAMAX) 50 MG tablet Take 2 tablets (100 mg total) by mouth at bedtime. 30 tablet 2   No current facility-administered medications on file prior to visit.     ALLERGIES: No Known Allergies  FAMILY HISTORY: Family History  Problem Relation Age of Onset  . Melanoma Father   . Cancer Paternal Grandmother   .  SOCIAL HISTORY: Social History   Socioeconomic History  . Marital status: Married    Spouse name: Not on file  . Number of children: 4  . Years of education: 1112  . Highest education level: Not on file  Occupational History    Employer: [employer]  Social Needs  . Financial resource strain: Not on file  . Food insecurity:    Worry: Not on file    Inability: Not on file  . Transportation needs:    Medical: Not on file    Non-medical: Not on file  Tobacco Use  . Smoking status: Current Every Day Smoker    Types: Cigarettes  . Smokeless tobacco: Never Used  Substance and Sexual Activity  . Alcohol use: Yes    Comment: occasionally  . Drug use: No  . Sexual activity: Not on file  Lifestyle  . Physical activity:    Days per week: Not on file    Minutes per session: Not on file  . Stress: Not on file  Relationships  . Social connections:    Talks on phone: Not on file    Gets together: Not on file    Attends religious service: Not on file    Active member of club or organization: Not on file    Attends meetings of clubs or organizations: Not on file    Relationship status: Not on file  . Intimate partner violence:    Fear of current or  ex partner: Not on file    Emotionally abused: Not on file    Physically abused: Not on file    Forced sexual activity: Not on file  Other Topics Concern  . Not on file  Social History Narrative   Lives with wife, 4 children, brother in Social workerlaw and sister in Social workerlaw.  Works at [employer].  Education: high school.    REVIEW OF SYSTEMS: Constitutional: No fevers, chills, or sweats, no generalized fatigue, change in appetite Eyes: No visual changes, double vision, eye pain Ear, nose and throat: No hearing loss, ear pain, nasal congestion, sore throat Cardiovascular: No chest pain, palpitations Respiratory:  No shortness of breath at rest or with exertion, wheezes GastrointestinaI: No nausea, vomiting, diarrhea, abdominal pain, fecal incontinence Genitourinary:  No dysuria, urinary retention or frequency Musculoskeletal:  No neck pain, back pain Integumentary: No rash, pruritus, skin lesions Neurological: as above Psychiatric: No depression, insomnia, anxiety Endocrine: No palpitations, fatigue, diaphoresis, mood swings, change in appetite, change in weight, increased thirst Hematologic/Lymphatic:  No purpura, petechiae. Allergic/Immunologic:  no itchy/runny eyes, nasal congestion, recent allergic reactions, rashes  PHYSICAL EXAM: Blood pressure 102/74, pulse 64, height 5\' 10"  (1.778 m), weight 178 lb (80.7 kg), SpO2 97 %. General: No acute distress.  Patient appears well-groomed.   Head:  Normocephalic/atraumatic Eyes:  Fundi examined but not visualized Neck: supple, no paraspinal tenderness, full range of motion Heart:  Regular rate and rhythm Lungs:  Clear to auscultation bilaterally Back: No paraspinal tenderness Neurological Exam: alert and oriented to person, place, and time. Attention span and concentration intact, recent and remote memory intact, fund of knowledge intact.  Speech fluent and not dysarthric, language intact.  CN II-XII intact. Bulk and tone normal, muscle strength  5/5 throughout.  Sensation to light touch, temperature and vibration intact.  Deep tendon reflexes 2+ throughout, toes downgoing.  Finger to nose and heel to shin testing intact.  Gait normal, Romberg negative.  IMPRESSION: Migraine without aura, without status migrainosus, not intractable Obstructive sleep apnea Tobacco use disorder  PLAN: 1.  To further reduce headache frequency, we will start Aimovig 70mg  monthly.   2.  He will continue topiramate 200mg  at bedtime 3.  For abortive therapy, sumatriptan 100mg  4.  Advised to stop Excedrin.  Limit use of pain relievers to no more than 2 days out of week to prevent risk of rebound or medication-overuse headache. 5.  Keep headache diary 6.  Advised to see sleep specialist. 7.  Follow up in 6 months.  [name], DO  CC: Assunta Found, MD

## 2018-04-24 ENCOUNTER — Encounter: Payer: Self-pay | Admitting: Neurology

## 2018-04-24 ENCOUNTER — Ambulatory Visit: Payer: BLUE CROSS/BLUE SHIELD | Admitting: Neurology

## 2018-04-24 VITALS — BP 102/74 | HR 64 | Ht 70.0 in | Wt 178.0 lb

## 2018-04-24 DIAGNOSIS — G4733 Obstructive sleep apnea (adult) (pediatric): Secondary | ICD-10-CM

## 2018-04-24 DIAGNOSIS — F17200 Nicotine dependence, unspecified, uncomplicated: Secondary | ICD-10-CM | POA: Diagnosis not present

## 2018-04-24 DIAGNOSIS — G43709 Chronic migraine without aura, not intractable, without status migrainosus: Secondary | ICD-10-CM

## 2018-04-24 DIAGNOSIS — G43009 Migraine without aura, not intractable, without status migrainosus: Secondary | ICD-10-CM

## 2018-04-24 MED ORDER — ERENUMAB-AOOE 70 MG/ML ~~LOC~~ SOAJ: 70.0000 mg | SUBCUTANEOUS | 0 refills | Status: DC

## 2018-04-24 MED ORDER — TOPIRAMATE 100 MG PO TABS: ORAL_TABLET | ORAL | 1 refills | Status: DC

## 2018-04-24 MED ORDER — ERENUMAB-AOOE 70 MG/ML ~~LOC~~ SOAJ: 70.0000 mg | SUBCUTANEOUS | 11 refills | Status: DC

## 2018-04-24 MED ORDER — SUMATRIPTAN SUCCINATE 100 MG PO TABS: ORAL_TABLET | ORAL | 1 refills | Status: AC

## 2018-04-24 NOTE — Patient Instructions (Signed)
1.  We will start one of the new monthly migraine shots, Aimovig 70mg  monthly 2.  Refilled topiramate 200mg  at bedtime 3.  Refilled sumatriptan as needed. 4.  Stop Excedrin.  Limit use of pain relievers to no more than 2 days out of week to prevent risk of rebound or medication-overuse headache. 5.  Follow up with sleep specialist regarding sleep apnea 6.  Follow up in 6 months.

## 2018-04-25 ENCOUNTER — Telehealth: Payer: Self-pay | Admitting: Neurology

## 2018-04-25 NOTE — Progress Notes (Signed)
Prior Authorization initiated via [email or web address] for pt's   Aimovig 70mg /mL Key: [id number]

## 2018-04-25 NOTE — Telephone Encounter (Signed)
Patient called regarding his migraine medication (Shot) and Insurance not going to cover it unless they hear from Dr. Everlena CooperJaffe? Please Call. Thanks

## 2018-04-26 NOTE — Telephone Encounter (Signed)
Called and spoke with Nehemiah SettleBrooke to make sure had been resolved, she said she had spoken with the Pt and he was to bring co-pay card back by for them to re-enter.

## 2018-04-26 NOTE — Telephone Encounter (Signed)
Patient was calling you back, please call him. Thanks!

## 2018-04-26 NOTE — Telephone Encounter (Signed)
Attempted to reach Pt, VM has not been set up. Authorization has been initiated. I explained the process to him when I gave him the injection in the office, as well as the co-pay card. I will explain it to him again to make sure he understands.

## 2018-04-26 NOTE — Telephone Encounter (Signed)
Called and advised Pt. He said [facility] would not allow him to use the copay card until PA was rcvd. I will call [facility].  [facility], spoke with WellPointShannon. She stated their system's primary would not allow them to process the card, she will try it in the 2ndry tier and let me know

## 2018-05-01 NOTE — Progress Notes (Signed)
Received notice from [email or web address] that pt's AIMOVIG. Has been denied and that the appeals process has been initiated.

## 2018-05-01 NOTE — Telephone Encounter (Signed)
Received notice from [email or web address] that pt's Aimovig has been denied.  Appeals process initiated.

## 2018-05-18 ENCOUNTER — Ambulatory Visit: Payer: BLUE CROSS/BLUE SHIELD | Admitting: Neurology

## 2018-06-21 ENCOUNTER — Other Ambulatory Visit: Payer: Self-pay | Admitting: Neurology

## 2018-10-23 ENCOUNTER — Encounter: Payer: Self-pay | Admitting: Neurology

## 2018-10-23 NOTE — Progress Notes (Signed)
Virtual Visit via Video Note The purpose of this virtual visit is to provide medical care while limiting exposure to the novel coronavirus.    Consent was obtained for video visit:  Yes.   Answered questions that patient had about telehealth interaction:  Yes.   I discussed the limitations, risks, security and privacy concerns of performing an evaluation and management service by telemedicine. I also discussed with the patient that there may be a patient responsible charge related to this service. The patient expressed understanding and agreed to proceed.  Pt location: Home Physician Location: Home Name of referring provider:  Sharilyn Sites, MD I connected with Wilford Grist at patients initiation/request on 10/24/2018 at  9:50 AM EDT by video enabled telemedicine application and verified that I am speaking with the correct person using two identifiers. Pt MRN:  [id number] Pt DOB:  07-Dec-1975 Video Participants:  Wilford Grist   History of Present Illness:  Dylan Shelton is a 43 year old right-handed man with OSA who follows up for migraines.  UPDATE: Off of Aimovig for last 2 months because ran out.  He thought Aimovig aggravated his migraines as he would have a severe migraine for 2 to 3 days prior to next injection. Intensity:  5-6/10 Duration:  2 hours with sumatriptan Frequency:  1 day a week Frequency of abortive medication: Sumatriptan once a week.  Excedrin for dull headache several days a week.   Current NSAIDS: None Current analgesics: Excedrin. Current triptans: Sumatriptan 100 mg Current ergotamine: None Current anti-emetic: None Current muscle relaxants: None Current anti-anxiolytic: None Current sleep aide: None Current Antihypertensive medications: None Current Antidepressant medications: Lexapro 10 mg Current Anticonvulsant medications: Topiramate 200 mg, gabapentin 100 mg daily (higher doses caused stomach upset) Current anti-CGRP: Aimovig 70mg  Current  Vitamins/Herbal/Supplements: None Current Antihistamines/Decongestants: None Other therapy: None  He also reports worsening pain in hands related.  Would like to restart gabapentin.  Caffeine: Stopped Alcohol: No Smoker: Yes Diet: Increased water intake Exercise: Walks Depression: No; Anxiety: No Other pain: Neck pain better Sleep hygiene: Poor.  Has uncontrolled OSA.  HISTORY: Onset: around January 2018 Location: right frontal Quality: Mild is dull, severe is throbbing Initial Intensity: Mild is 4-5/10; severe is 8-9/10 Aura: no Prodrome: no Postdrome: no Associated symptoms:  Osmophobia, nausea. No photophobia and phonophobia. He has not had specific visual disturbance with the headache, but he notes seeing spots in his vision and needs to blink in order to focus. He had a formal eye exam which was reportedly normal. He has not had any new worse headache of his life, waking up from sleep Initial Duration: 9-12 hours Initial Frequency: Daily (mild occurs 15 days per month; severe occurs 15 days per month) Initial Frequency of abortive medication: daily Triggers: Certain scents Relieving factors.  None Activity: aggravates  Past NSAIDS: ibuprofen, naproxen Past analgesics: Excedrin, Goody Past abortive triptans: no Past muscle relaxants: no Past anti-emetic: no Past antihypertensive medications: no Past antidepressant medications: no Past anticonvulsant medications: no Past vitamins/Herbal/Supplements: no Other past therapies: no  CT of head from 11/02/16 was personally reviewed and was normal.  Personal history of headache: He had headaches as a child up through middle school. Family history of headache: No  He has neck pain. Since 2018, he reports shooting pain from both hands radiating up the arms to the shoulders. He also endorses numbness in the hands. He saw orthopedics. MRI of cervical spine from 11/24/16 was personally reviewed  and revealed mild degenerative disc  bulging at C4-5 through C6-7 with mild right C4 and C5 foraminal stenosis but no significant canal stenosis. He reportedly had a NCV-EMG which showed maybe mild carpal tunnel syndrome in the left hand, as well as chronic radiculopathy. Steroid shots in the carpal tunnel were ineffective. He was placed on gabapentin 300mg  twice daily which helped the pain but he stopped because it may have caused GI upset.  Past Medical History: Past Medical History:  Diagnosis Date  . High cholesterol   . MRSA (methicillin resistant Staphylococcus aureus)   . Recurrent boils   . Renal disorder   . Sleep apnea     Medications: Outpatient Encounter Medications as of 10/24/2018  Medication Sig  . escitalopram (LEXAPRO) 10 MG tablet TK 1 T PO D  . naproxen (NAPROSYN) 500 MG tablet TAKE 1 TABLET(500 MG) BY MOUTH EVERY 12 HOURS AS NEEDED  . SUMAtriptan (IMITREX) 100 MG tablet Take 1 tablet earliest onset of migraine.  May repeat once in 2 hours if headache persists or recurs.  . topiramate (TOPAMAX) 100 MG tablet TAKE 2 TABLETS(200 MG) BY MOUTH AT BEDTIME  . [DISCONTINUED] Erenumab-aooe (AIMOVIG) 70 MG/ML SOAJ Inject 70 mg into the skin every 30 (thirty) days.  . [DISCONTINUED] gabapentin (NEURONTIN) 100 MG capsule Take 100 mg by mouth at bedtime.  Dorise Hiss. Erenumab-aooe (AIMOVIG) 70 MG/ML SOAJ Inject 70 mg into the skin every 30 (thirty) days. (Patient not taking: Reported on 10/23/2018)   No facility-administered encounter medications on file as of 10/24/2018.     Allergies: No Known Allergies  Family History: Family History  Problem Relation Age of Onset  . Melanoma Father   . Cancer Paternal Grandmother     Social History: Social History   Socioeconomic History  . Marital status: Married    Spouse name: Not on file  . Number of children: 4  . Years of education: 312  . Highest education level: Not on file  Occupational History    Employer: [employer]   Social Needs  . Financial resource strain: Not on file  . Food insecurity    Worry: Not on file    Inability: Not on file  . Transportation needs    Medical: Not on file    Non-medical: Not on file  Tobacco Use  . Smoking status: Current Every Day Smoker    Types: Cigarettes  . Smokeless tobacco: Never Used  Substance and Sexual Activity  . Alcohol use: Yes    Comment: occasionally  . Drug use: No  . Sexual activity: Yes    Partners: Female  Lifestyle  . Physical activity    Days per week: Not on file    Minutes per session: Not on file  . Stress: Not on file  Relationships  . Social Musicianconnections    Talks on phone: Not on file    Gets together: Not on file    Attends religious service: Not on file    Active member of club or organization: Not on file    Attends meetings of clubs or organizations: Not on file    Relationship status: Not on file  . Intimate partner violence    Fear of current or ex partner: Not on file    Emotionally abused: Not on file    Physically abused: Not on file    Forced sexual activity: Not on file  Other Topics Concern  . Not on file  Social History Narrative   Lives with wife, 4 children, brother in Social workerlaw  and sister in law.  Works at [employer].  Education: high school.   Observations/Objective:   Vitals:   10/23/18 1035  Weight: 180 lb (81.6 kg)  Height: 5\' 10"  (1.778 m)      Assessment and Plan:   1.  Migraine without aura, without status migrainosus, not intractable 2.  Bilateral hand pain/carpal tunnel  1.  For preventative management, topiramate 200mg  at bedtime refilled.  Did better off of Aimovig 2. Gabapentin 200mg  daily for hand pain refilled 3.  For abortive therapy, sumatriptan 4.  Limit use of pain relievers to no more than 2 days out of week to prevent risk of rebound or medication-overuse headache. 5.  Keep headache diary 6.  Exercise, hydration, caffeine cessation, sleep hygiene, monitor for and avoid triggers  7.  Consider:  magnesium citrate 400mg  daily, riboflavin 400mg  daily, and coenzyme Q10 100mg  three times daily 8. Always keep in mind that currently taking a hormone or birth control may be a possible trigger or aggravating factor for migraine. 9. Follow up 9 months.   Follow Up Instructions:    -I discussed the assessment and treatment plan with the patient. The patient was provided an opportunity to ask questions and all were answered. The patient agreed with the plan and demonstrated an understanding of the instructions.   The patient was advised to call back or seek an in-person evaluation if the symptoms worsen or if the condition fails to improve as anticipated.    Cira ServantAdam Robert Sequita Wise, DO

## 2018-10-24 ENCOUNTER — Telehealth (INDEPENDENT_AMBULATORY_CARE_PROVIDER_SITE_OTHER): Payer: BC Managed Care – PPO | Admitting: Neurology

## 2018-10-24 ENCOUNTER — Encounter: Payer: Self-pay | Admitting: Neurology

## 2018-10-24 ENCOUNTER — Other Ambulatory Visit: Payer: Self-pay

## 2018-10-24 VITALS — Ht 70.0 in | Wt 180.0 lb

## 2018-10-24 DIAGNOSIS — M79642 Pain in left hand: Secondary | ICD-10-CM

## 2018-10-24 DIAGNOSIS — M79641 Pain in right hand: Secondary | ICD-10-CM | POA: Diagnosis not present

## 2018-10-24 DIAGNOSIS — G43009 Migraine without aura, not intractable, without status migrainosus: Secondary | ICD-10-CM

## 2018-10-24 MED ORDER — GABAPENTIN 100 MG PO CAPS: 200.0000 mg | ORAL_CAPSULE | Freq: Every day | ORAL | 2 refills | Status: DC

## 2018-10-24 MED ORDER — TOPIRAMATE 100 MG PO TABS: 200.0000 mg | ORAL_TABLET | Freq: Every day | ORAL | 2 refills | Status: DC

## 2019-03-26 DIAGNOSIS — Z20828 Contact with and (suspected) exposure to other viral communicable diseases: Secondary | ICD-10-CM | POA: Diagnosis not present

## 2019-03-26 DIAGNOSIS — B349 Viral infection, unspecified: Secondary | ICD-10-CM | POA: Diagnosis not present

## 2019-04-08 DIAGNOSIS — H5203 Hypermetropia, bilateral: Secondary | ICD-10-CM | POA: Diagnosis not present

## 2019-04-08 DIAGNOSIS — Z135 Encounter for screening for eye and ear disorders: Secondary | ICD-10-CM | POA: Diagnosis not present

## 2019-07-18 ENCOUNTER — Other Ambulatory Visit: Payer: Self-pay | Admitting: Neurology

## 2019-07-18 DIAGNOSIS — G43009 Migraine without aura, not intractable, without status migrainosus: Secondary | ICD-10-CM

## 2019-07-24 NOTE — Progress Notes (Addendum)
NEUROLOGY FOLLOW UP OFFICE NOTE  ZEN CEDILLOS [id number]  HISTORY OF PRESENT ILLNESS: Dylan Shelton is a 44 year old right-handed man with OSA who follows up for migraines and bilateral carpal tunnel syndrome.  UPDATE: Intensity:  5-6/10 Duration:  2 hours with sumatriptan Frequency:  2 day a week However, he also reports a dull persistent non-throbbing frontal headache for the past 2 to 3 months. Frequency of abortive medication:Sumatriptan once a week. Excedrin for dull headache daily.  Current NSAIDS:None Current analgesics:Excedrin. Current triptans:Sumatriptan 100 mg Current ergotamine:None Current anti-emetic:None Current muscle relaxants:None Current anti-anxiolytic:None Current sleep aide:None Current Antihypertensive medications:None Current Antidepressant medications:Lexapro 10 mg Current Anticonvulsant medications:Topiramate 200 mg, gabapentin 100 mg daily (higher doses caused stomach upset) Current anti-CGRP:none Current Vitamins/Herbal/Supplements:None Current Antihistamines/Decongestants:None Other therapy:None  He is taking gabapentin for carpal tunnel but feels that it is getting worse.    Caffeine:Stopped Alcohol:No Smoker:Yes Diet:Increased water intake Exercise:Walks Depression:No; Anxiety:No Other pain:Neck pain better Sleep hygiene:Poor. Has uncontrolled OSA.  HISTORY: Onset: around January 2018 Location: right frontal Quality: Mild is dull, severe is throbbing Initial Intensity: Mild is 4-5/10; severe is 8-9/10 Aura: no Prodrome: no Postdrome: no Associated symptoms:  Osmophobia, nausea. No photophobia and phonophobia. He has not had specific visual disturbance with the headache, but he notes seeing spots in his vision and needs to blink in order to focus. He had a formal eye exam which was reportedly normal. He has not had any new worse headache of his life, waking up from sleep Initial Duration: 9-12  hours Initial Frequency: Daily (mild occurs 15 days per month; severe occurs 15 days per month) Initial Frequency of abortive medication: daily Triggers: Certain scents Relieving factors.  None Activity: aggravates  Past NSAIDS: ibuprofen, naproxen Past analgesics: Excedrin, Goody Past abortive triptans: no Past muscle relaxants: no Past anti-emetic: no Past antihypertensive medications: no Past antidepressant medications: no Past anticonvulsant medications: no Past CGRP-inhibitor:  Aimovig (aggravated migraines) Past vitamins/Herbal/Supplements: no Other past therapies: no  CT of head from 11/02/16 was personally reviewed and was normal.  Personal history of headache: He had headaches as a child up through middle school. Family history of headache: No  He has neck pain. Since 2018, he reports shooting pain from both hands radiating up the arms to the shoulders. He also endorses numbness in the hands. He saw orthopedics. MRI of cervical spine from 11/24/16 was personally reviewed and revealed mild degenerative disc bulging at C4-5 through C6-7 with mild right C4 and C5 foraminal stenosis but no significant canal stenosis. He reportedly had a NCV-EMG which showed maybe mild carpal tunnel syndrome in the left hand, as well as chronic radiculopathy. Steroid shots in the carpal tunnel were ineffective. He was placed on gabapentin 300mg  twice daily which helped the pain but he stopped because it may have caused GI upset.  PAST MEDICAL HISTORY: Past Medical History:  Diagnosis Date  . High cholesterol   . MRSA (methicillin resistant Staphylococcus aureus)   . Recurrent boils   . Renal disorder   . Sleep apnea     MEDICATIONS: Current Outpatient Medications on File Prior to Visit  Medication Sig Dispense Refill  . Erenumab-aooe (AIMOVIG) 70 MG/ML SOAJ Inject 70 mg into the skin every 30 (thirty) days. (Patient not taking: Reported on 10/23/2018) 1 pen 11  .  escitalopram (LEXAPRO) 10 MG tablet TK 1 T PO D  11  . gabapentin (NEURONTIN) 100 MG capsule TAKE 2 CAPSULES(200 MG) BY MOUTH DAILY 180 capsule 2  .  naproxen (NAPROSYN) 500 MG tablet TAKE 1 TABLET(500 MG) BY MOUTH EVERY 12 HOURS AS NEEDED 15 tablet 2  . SUMAtriptan (IMITREX) 100 MG tablet Take 1 tablet earliest onset of migraine.  May repeat once in 2 hours if headache persists or recurs. 30 tablet 1  . topiramate (TOPAMAX) 100 MG tablet TAKE 2 TABLETS(200 MG) BY MOUTH AT BEDTIME 180 tablet 2   No current facility-administered medications on file prior to visit.    ALLERGIES: No Known Allergies  FAMILY HISTORY: Family History  Problem Relation Age of Onset  . Melanoma Father   . Cancer Paternal Grandmother    SOCIAL HISTORY: Social History   Socioeconomic History  . Marital status: Married    Spouse name: Not on file  . Number of children: 4  . Years of education: 32  . Highest education level: Not on file  Occupational History    Employer: [employer]  Tobacco Use  . Smoking status: Current Every Day Smoker    Types: Cigarettes  . Smokeless tobacco: Never Used  Substance and Sexual Activity  . Alcohol use: Yes    Comment: occasionally  . Drug use: No  . Sexual activity: Yes    Partners: Female  Other Topics Concern  . Not on file  Social History Narrative   Lives with wife, 4 children, brother in Social worker and sister in Social worker.  Works at [employer].  Education: high school.   Social Determinants of Health   Financial Resource Strain:   . Difficulty of Paying Living Expenses:   Food Insecurity:   . Worried About Programme researcher, broadcasting/film/video in the Last Year:   . Barista in the Last Year:   Transportation Needs:   . Freight forwarder (Medical):   Marland Kitchen Lack of Transportation (Non-Medical):   Physical Activity:   . Days of Exercise per Week:   . Minutes of Exercise per Session:   Stress:   . Feeling of Stress :   Social Connections:   . Frequency of  Communication with Friends and Family:   . Frequency of Social Gatherings with Friends and Family:   . Attends Religious Services:   . Active Member of Clubs or Organizations:   . Attends Banker Meetings:   Marland Kitchen Marital Status:   Intimate Partner Violence:   . Fear of Current or Ex-Partner:   . Emotionally Abused:   Marland Kitchen Physically Abused:   . Sexually Abused:     PHYSICAL EXAM: Blood pressure 138/83, pulse 64, resp. rate 18, height 5\' 10"  (1.778 m), weight 191 lb (86.6 kg), SpO2 99 %. General: No acute distress.  Patient appears well-groomed.   Head:  Normocephalic/atraumatic Eyes:  Fundi examined but not visualized Neck: supple, no paraspinal tenderness, full range of motion Neurological Exam: alert and oriented to person, place, and time. Attention span and concentration intact, recent and remote memory intact, fund of knowledge intact.  Speech fluent and not dysarthric, language intact.  CN II-XII intact. Bulk and tone normal, muscle strength 5/5 throughout.  Sensation to light touch, temperature and vibration intact.  Deep tendon reflexes 2+ throughout, toes downgoing.  Finger to nose and heel to shin testing intact.  Gait normal, Romberg negative.  IMPRESSION: 1.  Migraine without aura, without status migrainosus, not intractable 2.  Bilateral hand pain/carpal tunnel syndrome  PLAN: 1.  For preventative migraine management, topiramate 200mg  at bedtime 2.  For hand pain, gabapentin 200mg  daily 3.  For abortive migraine therapy,  sumatriptan 4.  Limit use of pain relievers to no more than 2 days out of week to prevent risk of rebound or medication-overuse headache. 5.  Keep headache diary 6.  Exercise, hydration, caffeine cessation, sleep hygiene, monitor for and avoid triggers 7. Follow up 5 months   [name], DO  CC: Assunta Found, MD

## 2019-07-25 ENCOUNTER — Encounter: Payer: Self-pay | Admitting: Neurology

## 2019-07-25 ENCOUNTER — Other Ambulatory Visit: Payer: Self-pay

## 2019-07-25 ENCOUNTER — Ambulatory Visit: Payer: BC Managed Care – PPO | Admitting: Neurology

## 2019-07-25 VITALS — BP 138/83 | HR 64 | Resp 18 | Ht 70.0 in | Wt 191.0 lb

## 2019-07-25 DIAGNOSIS — G5603 Carpal tunnel syndrome, bilateral upper limbs: Secondary | ICD-10-CM | POA: Diagnosis not present

## 2019-07-25 DIAGNOSIS — G43709 Chronic migraine without aura, not intractable, without status migrainosus: Secondary | ICD-10-CM | POA: Diagnosis not present

## 2019-07-25 MED ORDER — EMGALITY 120 MG/ML ~~LOC~~ SOAJ: 120.0000 mg | SUBCUTANEOUS | 11 refills | Status: AC

## 2019-07-25 NOTE — Patient Instructions (Signed)
1.  Start Emgality.  Take 2 shots for first dose then 1 shot every 28 days 2.  Continue topiramate 3.  Will get a nerve study of both arms.  Continue gabapentin.  Wear wrist splints when possible 4.  Stop Excedrin.  Use sumatriptan for migraine attacks.  Limit use of pain relievers to no more than 2 days out of week to prevent risk of rebound or medication-overuse headache. 5.  Follow up in 5 months.

## 2019-08-01 DIAGNOSIS — Z23 Encounter for immunization: Secondary | ICD-10-CM | POA: Diagnosis not present

## 2019-08-21 ENCOUNTER — Other Ambulatory Visit: Payer: Self-pay

## 2019-08-21 ENCOUNTER — Ambulatory Visit: Payer: BC Managed Care – PPO | Admitting: Neurology

## 2019-08-21 DIAGNOSIS — G5603 Carpal tunnel syndrome, bilateral upper limbs: Secondary | ICD-10-CM

## 2019-08-21 DIAGNOSIS — G43709 Chronic migraine without aura, not intractable, without status migrainosus: Secondary | ICD-10-CM

## 2019-08-21 MED ORDER — AIMOVIG 70 MG/ML ~~LOC~~ SOAJ: 70.0000 mg | SUBCUTANEOUS | 11 refills | Status: AC

## 2019-08-21 NOTE — Procedures (Signed)
[facility]  [address], [address]  [place], [place] [address] Tel: [phone] Fax:  [phone] Test Date:  08/21/2019  Patient: Dylan Shelton DOB: [birth year] Physician: Narda Amber, DO  Sex: Male Height: 5\' 10"  Ref Phys: Metta Clines, D.O.  ID#: [id number] Temp: 32.0C Technician:    Patient Complaints: This is a 44 year old man referred for evaluation of bilateral arm pain and paresthesias.  NCV & EMG Findings: Extensive electrodiagnostic testing of the right upper extremity and additional  studies of the left shows:  1. Right median sensory response shows prolonged latency (4.1 ms) and reduced amplitude (16.2 V).  Left median sensory response shows prolonged latency (3.6 ms) and normal amplitude.  Bilateral ulnar sensory responses are within normal limits. 2. Right median motor response shows prolonged latency (4.1 ms).  Left median and bilateral ulnar motor responses are within normal limits.  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 3. Sparse chronic motor axonal loss changes are seen affecting the right abductor pollicis brevis muscle, without accompanied active denervation.  These findings are not present in the left upper extremity.  Impression: 1. 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   2. Left median neuropathy at or distal to the wrist (mild), consistent with a clinical diagnosis of carpal tunnel syndrome.     ___________________________ Narda Amber, DO    Nerve Conduction Studies Anti Sensory Summary Table   Stim Site NR Peak (ms) Norm Peak (ms) P-T Amp (V) Norm P-T Amp  Left Median Anti Sensory (2nd Digit)  32C  Wrist    3.6 <3.4 22.0 >20  Right Median Anti Sensory (2nd Digit)  32C  Wrist    4.1 <3.4  16.2 >20  Left Ulnar Anti Sensory (5th Digit)  32C  Wrist    2.8 <3.1 22.2 >12  Right Ulnar Anti Sensory (5th Digit)  32C  Wrist    2.7 <3.1 28.3 >12   Motor Summary Table   Stim Site NR Onset (ms) Norm Onset (ms) O-P Amp (mV) Norm O-P Amp Site1 Site2 Delta-0 (ms) Dist (cm) Vel (m/s) Norm Vel (m/s)  Left Median Motor (Abd Poll Brev)  32C  Wrist    3.4 <3.9 6.8 >6 Elbow Wrist 4.9 28.0 57 >50  Elbow    8.3  6.7  Ulnar-wrist crossover Elbow 3.9 0.0    Ulnar-wrist crossover    4.4  2.0         Right Median Motor (Abd Poll Brev)  32C  Wrist    4.1 <3.9 6.6 >6 Elbow Wrist 5.0 29.0 58 >50  Elbow    9.1  6.1  Ulbar-wrist crossover Elbow 5.3 0.0    Ulbar-wrist crossover    3.8  3.9         Left Ulnar Motor (Abd Dig Minimi)  32C  Wrist    2.3 <3.1 9.3 >7 B Elbow Wrist 3.8 24.0 63 >50  B Elbow    6.1  8.6  A Elbow B Elbow 1.8 10.0 56 >50  A Elbow    7.9  8.0         Right Ulnar Motor (Abd Dig Minimi)  32C  Wrist    2.1 <3.1 9.7 >7 B Elbow Wrist 3.8 22.0 58 >50  B Elbow    5.9  9.6  A Elbow B Elbow 1.8 10.0 56 >50  A Elbow    7.7  9.6          EMG   Side Muscle Ins Act Fibs Psw Fasc Number Recrt Dur Dur. Amp Amp. Poly Poly. Comment  Right 1stDorInt Nml Nml Nml Nml Nml Nml Nml Nml Nml Nml Nml Nml N/A  Right Abd Poll Brev Nml Nml Nml Nml Nml Mod-R Few 1+ Few 1+ Few 1+ N/A  Right PronatorTeres Nml Nml Nml Nml Nml Nml Nml Nml Nml Nml Nml Nml N/A  Right Biceps Nml Nml Nml Nml Nml Nml Nml Nml Nml Nml Nml Nml N/A  Right Triceps Nml Nml Nml Nml Nml Nml Nml Nml Nml Nml Nml Nml N/A  Right Deltoid Nml Nml Nml Nml Nml Nml Nml Nml Nml Nml Nml Nml N/A  Left 1stDorInt Nml Nml Nml Nml Nml Nml Nml Nml Nml Nml Nml Nml N/A  Left Abd Poll Brev Nml Nml Nml Nml Nml Nml Nml Nml Nml Nml Nml Nml N/A  Left PronatorTeres Nml Nml Nml Nml Nml Nml Nml Nml Nml Nml Nml Nml N/A      Waveforms:

## 2019-08-22 ENCOUNTER — Other Ambulatory Visit: Payer: Self-pay

## 2019-08-22 ENCOUNTER — Telehealth: Payer: Self-pay

## 2019-08-22 DIAGNOSIS — G5603 Carpal tunnel syndrome, bilateral upper limbs: Secondary | ICD-10-CM

## 2019-08-22 NOTE — Telephone Encounter (Signed)
Pt called states [facility] told him ins required prior auth for AIMOVIG.  Please request PA and update pt on status. Pt informed will take a few days to obtain prior auth.

## 2019-08-22 NOTE — Telephone Encounter (Signed)
Telephone call to pt in regards to his Nerve study. Results given.   Per Dr. Everlena Cooper we can refer pt to a hand specialist it pt would like that. Per Pt he would like to have the referral place.

## 2019-08-24 DIAGNOSIS — Z23 Encounter for immunization: Secondary | ICD-10-CM | POA: Diagnosis not present

## 2019-08-27 ENCOUNTER — Ambulatory Visit (INDEPENDENT_AMBULATORY_CARE_PROVIDER_SITE_OTHER): Payer: BC Managed Care – PPO | Admitting: Orthopaedic Surgery

## 2019-08-27 ENCOUNTER — Encounter: Payer: Self-pay | Admitting: Orthopaedic Surgery

## 2019-08-27 ENCOUNTER — Telehealth: Payer: Self-pay | Admitting: Orthopedic Surgery

## 2019-08-27 ENCOUNTER — Other Ambulatory Visit: Payer: Self-pay

## 2019-08-27 VITALS — BP 111/74 | HR 56 | Ht 70.0 in | Wt 190.0 lb

## 2019-08-27 DIAGNOSIS — G5601 Carpal tunnel syndrome, right upper limb: Secondary | ICD-10-CM

## 2019-08-27 NOTE — Telephone Encounter (Signed)
Dylan Shelton (Key: [id number]) Aimovig 70MG/ML auto-injectors Status: PA Response - Approved 

## 2019-08-27 NOTE — Telephone Encounter (Signed)
Patient called following appointment, requests records from today's office visit; states he will be going to another doctor. Advised of signing release form; aware to come in to sign and complete release; record request to be completed upon signing.

## 2019-08-27 NOTE — Progress Notes (Signed)
Subjective:    Patient ID: Dylan Shelton, male    DOB: 09/27/1975, 44 y.o.   MRN: [id number]  HPI He has numbness of both hands, more on the right.  It was more at night but now is bothering him during the day.  He has tried braces, rest, ice, medicine with no help.  He had EMGs showing moderated carpal tunnel on the right and mild on the left.  He has seen Dr. Phillips Odor who asked we see the patient today.  I have reviewed the notes and also the EMG report.  He needs carpal tunnel release.  I will have him see Dr. Romeo Apple for this outpatient procedure.   Review of Systems  Constitutional: Positive for activity change.  Musculoskeletal: Positive for arthralgias and myalgias.  All other systems reviewed and are negative.  For Review of Systems, all other systems reviewed and are negative.  The following is a summary of the past history medically, past history surgically, known current medicines, social history and family history.  This information is gathered electronically by the computer from prior information and documentation.  I review this each visit and have found including this information at this point in the chart is beneficial and informative.   Past Medical History:  Diagnosis Date  . High cholesterol   . MRSA (methicillin resistant Staphylococcus aureus)   . Recurrent boils   . Renal disorder   . Sleep apnea     History reviewed. No pertinent surgical history.  Current Outpatient Medications on File Prior to Visit  Medication Sig Dispense Refill  . Erenumab-aooe (AIMOVIG) 70 MG/ML SOAJ Inject 70 mg into the skin every 30 (thirty) days. 1 pen 11  . escitalopram (LEXAPRO) 10 MG tablet TK 1 T PO D  11  . gabapentin (NEURONTIN) 100 MG capsule TAKE 2 CAPSULES(200 MG) BY MOUTH DAILY 180 capsule 2  . Galcanezumab-gnlm (EMGALITY) 120 MG/ML SOAJ Inject 120 mg into the skin every 28 (twenty-eight) days. 1 pen 11  . naproxen (NAPROSYN) 500 MG tablet TAKE 1 TABLET(500 MG)  BY MOUTH EVERY 12 HOURS AS NEEDED 15 tablet 2  . SUMAtriptan (IMITREX) 100 MG tablet Take 1 tablet earliest onset of migraine.  May repeat once in 2 hours if headache persists or recurs. 30 tablet 1  . topiramate (TOPAMAX) 100 MG tablet TAKE 2 TABLETS(200 MG) BY MOUTH AT BEDTIME 180 tablet 2   No current facility-administered medications on file prior to visit.    Social History   Socioeconomic History  . Marital status: Divorced    Spouse name: Not on file  . Number of children: 4  . Years of education: 37  . Highest education level: Not on file  Occupational History    Employer: [employer]  Tobacco Use  . Smoking status: Current Every Day Smoker    Types: Cigarettes  . Smokeless tobacco: Never Used  Substance and Sexual Activity  . Alcohol use: Yes    Comment: occasionally  . Drug use: No  . Sexual activity: Yes    Partners: Female  Other Topics Concern  . Not on file  Social History Narrative   Lives with wife, 4 children, brother in Social worker and sister in Social worker.  Works at [employer].  Education: high school.   Right handed   One story home    Social Determinants of Health   Financial Resource Strain:   . Difficulty of Paying Living Expenses:   Food Insecurity:   . Worried About  Running Out of Food in the Last Year:   . [place] in the Last Year:   Transportation Needs:   . Lack of Transportation (Medical):   Marland Kitchen Lack of Transportation (Non-Medical):   Physical Activity:   . Days of Exercise per Week:   . Minutes of Exercise per Session:   Stress:   . Feeling of Stress :   Social Connections:   . Frequency of Communication with Friends and Family:   . Frequency of Social Gatherings with Friends and Family:   . Attends Religious Services:   . Active Member of Clubs or Organizations:   . Attends Archivist Meetings:   Marland Kitchen Marital Status:   Intimate Partner Violence:   . Fear of Current or Ex-Partner:   . Emotionally Abused:   Marland Kitchen  Physically Abused:   . Sexually Abused:     Family History  Problem Relation Age of Onset  . Melanoma Father   . Cancer Paternal Grandmother     BP 111/74   Pulse (!) 56   Ht 5\' 10"  (1.778 m)   Wt 190 lb (86.2 kg)   BMI 27.26 kg/m   Body mass index is 27.26 kg/m.     Objective:   Physical Exam Vitals and nursing note reviewed.  Constitutional:      Appearance: He is well-developed.  HENT:     Head: Normocephalic and atraumatic.  Eyes:     Conjunctiva/sclera: Conjunctivae normal.     Pupils: Pupils are equal, round, and reactive to light.  Cardiovascular:     Rate and Rhythm: Normal rate and regular rhythm.  Pulmonary:     Effort: Pulmonary effort is normal.  Abdominal:     Palpations: Abdomen is soft.  Musculoskeletal:       Hands:     Cervical back: Normal range of motion and neck supple.  Skin:    General: Skin is warm and dry.  Neurological:     Mental Status: He is alert and oriented to person, place, and time.     Cranial Nerves: No cranial nerve deficit.     Motor: No abnormal muscle tone.     Coordination: Coordination normal.     Deep Tendon Reflexes: Reflexes are normal and symmetric. Reflexes normal.  Psychiatric:        Behavior: Behavior normal.        Thought Content: Thought content normal.        Judgment: Judgment normal.           Assessment & Plan:   Encounter Diagnosis  Name Primary?  . Carpal tunnel syndrome, right upper limb Yes   I have explained the surgery to him and his wife.  He will see Dr. Aline Brochure.  Call if any problem.  Precautions discussed.   Electronically Signed Sanjuana Kava, MD 4/20/202111:12 AM

## 2019-08-27 NOTE — Progress Notes (Signed)
Dylan Shelton (Key: [id number]) Aimovig 70MG /ML auto-injectors Status: PA Response - Approved

## 2019-08-27 NOTE — Patient Instructions (Addendum)
Referred to Dr. Romeo Apple for CT consultation.

## 2019-08-27 NOTE — Telephone Encounter (Signed)
Noted  

## 2019-10-01 DIAGNOSIS — G5601 Carpal tunnel syndrome, right upper limb: Secondary | ICD-10-CM | POA: Diagnosis not present

## 2019-10-01 DIAGNOSIS — G5602 Carpal tunnel syndrome, left upper limb: Secondary | ICD-10-CM | POA: Diagnosis not present

## 2019-10-08 DIAGNOSIS — L720 Epidermal cyst: Secondary | ICD-10-CM | POA: Diagnosis not present

## 2019-10-08 DIAGNOSIS — D234 Other benign neoplasm of skin of scalp and neck: Secondary | ICD-10-CM | POA: Diagnosis not present

## 2019-10-08 DIAGNOSIS — L814 Other melanin hyperpigmentation: Secondary | ICD-10-CM | POA: Diagnosis not present

## 2019-10-08 DIAGNOSIS — D2339 Other benign neoplasm of skin of other parts of face: Secondary | ICD-10-CM | POA: Diagnosis not present

## 2019-10-08 DIAGNOSIS — L570 Actinic keratosis: Secondary | ICD-10-CM | POA: Diagnosis not present

## 2019-10-08 DIAGNOSIS — D485 Neoplasm of uncertain behavior of skin: Secondary | ICD-10-CM | POA: Diagnosis not present

## 2019-10-08 DIAGNOSIS — D235 Other benign neoplasm of skin of trunk: Secondary | ICD-10-CM | POA: Diagnosis not present

## 2019-10-17 DIAGNOSIS — F17210 Nicotine dependence, cigarettes, uncomplicated: Secondary | ICD-10-CM | POA: Diagnosis not present

## 2019-10-17 DIAGNOSIS — G43909 Migraine, unspecified, not intractable, without status migrainosus: Secondary | ICD-10-CM | POA: Diagnosis not present

## 2019-10-17 DIAGNOSIS — G4730 Sleep apnea, unspecified: Secondary | ICD-10-CM | POA: Diagnosis not present

## 2019-10-17 DIAGNOSIS — G5601 Carpal tunnel syndrome, right upper limb: Secondary | ICD-10-CM | POA: Diagnosis not present

## 2019-10-17 DIAGNOSIS — G5603 Carpal tunnel syndrome, bilateral upper limbs: Secondary | ICD-10-CM | POA: Diagnosis not present

## 2019-10-17 DIAGNOSIS — G629 Polyneuropathy, unspecified: Secondary | ICD-10-CM | POA: Diagnosis not present

## 2019-10-17 DIAGNOSIS — F419 Anxiety disorder, unspecified: Secondary | ICD-10-CM | POA: Diagnosis not present

## 2019-10-17 DIAGNOSIS — Z79899 Other long term (current) drug therapy: Secondary | ICD-10-CM | POA: Diagnosis not present

## 2019-11-05 DIAGNOSIS — L538 Other specified erythematous conditions: Secondary | ICD-10-CM | POA: Diagnosis not present

## 2019-11-05 DIAGNOSIS — L729 Follicular cyst of the skin and subcutaneous tissue, unspecified: Secondary | ICD-10-CM | POA: Diagnosis not present

## 2019-11-05 DIAGNOSIS — R208 Other disturbances of skin sensation: Secondary | ICD-10-CM | POA: Diagnosis not present

## 2019-11-05 DIAGNOSIS — L720 Epidermal cyst: Secondary | ICD-10-CM | POA: Diagnosis not present

## 2019-12-27 NOTE — Progress Notes (Signed)
NEUROLOGY FOLLOW UP OFFICE NOTE  Dylan Shelton [id number]  HISTORY OF PRESENT ILLNESS: Dylan Shelton is a 44 year old right-handed man with OSA who follows up for migraines.  UPDATE: NCV-EMG on 08/21/2019 confirmed moderate right and mild left carpal tunnel syndrome.  He was referred to a hand specialist and underwent carpal tunnel release on right hand.  He no longer has sharp pains.  He notes numbness and tingling in the feet.  Started Aimovig.  He no longer has migraines at home but has dull headaches at work.  He works at a [employer] and wears earplugs.   Intensity: mild Duration: 4 to 5 hours without management Frequency: 2 days a week However, he also reports a dull persistent non-throbbing frontal headache for the past 2 to 3 months. Frequency of abortive medication:Sumatriptan once a week. Excedrin for dull headache daily.  Current NSAIDS:None Current analgesics:Excedrin. Current triptans:Sumatriptan 100 mg Current ergotamine:None Current anti-emetic:None Current muscle relaxants:None Current anti-anxiolytic:None Current sleep aide:None Current Antihypertensive medications:None Current Antidepressant medications:Lexapro 10 mg Current Anticonvulsant medications:Topiramate200 mg, gabapentin 200 mg daily Current anti-CGRP:Aimovig 70mg  Current Vitamins/Herbal/Supplements:None Current Antihistamines/Decongestants:None Other therapy:None  He is taking gabapentin for carpal tunnel but feels that it is getting worse.    Caffeine:Stopped Alcohol:No Smoker:Yes Diet:Increased water intake Exercise:Walks Depression:No; Anxiety:No Other pain:Neck pain better Sleep hygiene:Poor. Has uncontrolled OSA.  HISTORY: Onset: around January 2018 Location: right frontal Quality: Mild is dull, severe is throbbing Initial Intensity: Mild is 4-5/10; severe is 8-9/10 Aura: no Prodrome: no Postdrome: no Associated symptoms: Osmophobia, nausea.  No photophobia and phonophobia. He has not had specific visual disturbance with the headache, but he notes seeing spots in his vision and needs to blink in order to focus. He had a formal eye exam which was reportedly normal. He has not had any new worse headache of his life, waking up from sleep Initial Duration: 9-12 hours Initial Frequency: Daily (mild occurs 15 days per month; severe occurs 15 days per month) Initial Frequency of abortive medication: daily Triggers: Certain scents Relieving factors. None Activity: aggravates  Past NSAIDS: ibuprofen, naproxen Past analgesics: Excedrin, Goody Past abortive triptans: no Past muscle relaxants: no Past anti-emetic: no Past antihypertensive medications: no Past antidepressant medications: no Past anticonvulsant medications: no Past CGRP-inhibitor:  Aimovig (aggravated migraines) Past vitamins/Herbal/Supplements: no Other past therapies: no  CT of head from 11/02/16 was personally reviewed and was normal.  Personal history of headache: He had headaches as a child up through middle school. Family history of headache: No  He has neck pain. Since 2018, he reports shooting pain from both hands radiating up the arms to the shoulders. He also endorses numbness in the hands. He saw orthopedics. MRI of cervical spine from 11/24/16 was personally reviewed and revealed mild degenerative disc bulging at C4-5 through C6-7 with mild right C4 and C5 foraminal stenosis but no significant canal stenosis. He reportedly had a NCV-EMG which showed maybe mild carpal tunnel syndrome in the left hand, as well as chronic radiculopathy. Steroid shots in the carpal tunnel were ineffective. He was placed on gabapentin 300mg  twice daily which helped the pain but he stopped because it may have caused GI upset.  PAST MEDICAL HISTORY: Past Medical History:  Diagnosis Date  . High cholesterol   . MRSA (methicillin resistant Staphylococcus  aureus)   . Recurrent boils   . Renal disorder   . Sleep apnea     MEDICATIONS: Current Outpatient Medications on File Prior to Visit  Medication Sig  Dispense Refill  . Erenumab-aooe (AIMOVIG) 70 MG/ML SOAJ Inject 70 mg into the skin every 30 (thirty) days. 1 pen 11  . escitalopram (LEXAPRO) 10 MG tablet TK 1 T PO D  11  . gabapentin (NEURONTIN) 100 MG capsule TAKE 2 CAPSULES(200 MG) BY MOUTH DAILY 180 capsule 2  . Galcanezumab-gnlm (EMGALITY) 120 MG/ML SOAJ Inject 120 mg into the skin every 28 (twenty-eight) days. 1 pen 11  . naproxen (NAPROSYN) 500 MG tablet TAKE 1 TABLET(500 MG) BY MOUTH EVERY 12 HOURS AS NEEDED 15 tablet 2  . SUMAtriptan (IMITREX) 100 MG tablet Take 1 tablet earliest onset of migraine.  May repeat once in 2 hours if headache persists or recurs. 30 tablet 1  . topiramate (TOPAMAX) 100 MG tablet TAKE 2 TABLETS(200 MG) BY MOUTH AT BEDTIME 180 tablet 2   No current facility-administered medications on file prior to visit.    ALLERGIES: No Known Allergies  FAMILY HISTORY: Family History  Problem Relation Age of Onset  . Melanoma Father   . Cancer Paternal Grandmother    SOCIAL HISTORY: Social History   Socioeconomic History  . Marital status: Divorced    Spouse name: Not on file  . Number of children: 4  . Years of education: 30  . Highest education level: Not on file  Occupational History    Employer: [employer]  Tobacco Use  . Smoking status: Current Every Day Smoker    Types: Cigarettes  . Smokeless tobacco: Never Used  Vaping Use  . Vaping Use: Never used  Substance and Sexual Activity  . Alcohol use: Yes    Comment: occasionally  . Drug use: No  . Sexual activity: Yes    Partners: Female  Other Topics Concern  . Not on file  Social History Narrative   Lives with wife, 4 children, brother in Social worker and sister in Social worker.  Works at [employer].  Education: high school.   Right handed   One story home    Social Determinants of Health     Financial Resource Strain:   . Difficulty of Paying Living Expenses: Not on file  Food Insecurity:   . Worried About Programme researcher, broadcasting/film/video in the Last Year: Not on file  . Ran Out of Food in the Last Year: Not on file  Transportation Needs:   . Lack of Transportation (Medical): Not on file  . Lack of Transportation (Non-Medical): Not on file  Physical Activity:   . Days of Exercise per Week: Not on file  . Minutes of Exercise per Session: Not on file  Stress:   . Feeling of Stress : Not on file  Social Connections:   . Frequency of Communication with Friends and Family: Not on file  . Frequency of Social Gatherings with Friends and Family: Not on file  . Attends Religious Services: Not on file  . Active Member of Clubs or Organizations: Not on file  . Attends Banker Meetings: Not on file  . Marital Status: Not on file  Intimate Partner Violence:   . Fear of Current or Ex-Partner: Not on file  . Emotionally Abused: Not on file  . Physically Abused: Not on file  . Sexually Abused: Not on file    PHYSICAL EXAM: Blood pressure 126/85, pulse 61, height 5\' 9"  (1.753 m), weight 197 lb 12.8 oz (89.7 kg), SpO2 99 %. General: No acute distress.  Patient appears well-groomed.   Head:  Normocephalic/atraumatic Eyes:  Fundi examined but not visualized  Neck: supple, no paraspinal tenderness, full range of motion Heart:  Regular rate and rhythm Lungs:  Clear to auscultation bilaterally Back: No paraspinal tenderness Neurological Exam: alert and oriented to person, place, and time. Attention span and concentration intact, recent and remote memory intact, fund of knowledge intact.  Speech fluent and not dysarthric, language intact.  CN II-XII intact. Bulk and tone normal, muscle strength 5/5 throughout.  Sensation to light touch, temperature and vibration intact.  Deep tendon reflexes 2+ throughout, toes downgoing.  Finger to nose and heel to shin testing intact.  Gait normal,  Romberg negative.  IMPRESSION: Migraine without aura, without status migrainosus, not intractable Bilateral carpal tunnel syndrome Paresthsia in feet.  Likely secondary to topiramate.  PLAN: 1.  For preventative management, Aimovig 70mg  monthly 2.  Decrease topiramate to 100mg  at bedtime 3.  Gabapentin for left sided carpal tunnel discomfort. 4.  For abortive therapy, ibuprofen or Excedrin for tension type headache, sumatriptan for migraine 5.  Limit use of pain relievers to no more than 2 days out of week to prevent risk of rebound or medication-overuse headache. 6.  Keep headache diary 7.  Exercise, hydration, caffeine cessation, sleep hygiene, monitor for and avoid triggers 8.  Follow up 6 months.   , DO  CC: , MD

## 2019-12-30 ENCOUNTER — Encounter: Payer: Self-pay | Admitting: Neurology

## 2019-12-30 ENCOUNTER — Ambulatory Visit: Payer: BC Managed Care – PPO | Admitting: Neurology

## 2019-12-30 ENCOUNTER — Other Ambulatory Visit: Payer: Self-pay

## 2019-12-30 VITALS — BP 126/85 | HR 61 | Ht 69.0 in | Wt 197.8 lb

## 2019-12-30 DIAGNOSIS — R202 Paresthesia of skin: Secondary | ICD-10-CM | POA: Diagnosis not present

## 2019-12-30 DIAGNOSIS — G5603 Carpal tunnel syndrome, bilateral upper limbs: Secondary | ICD-10-CM | POA: Diagnosis not present

## 2019-12-30 DIAGNOSIS — G43009 Migraine without aura, not intractable, without status migrainosus: Secondary | ICD-10-CM

## 2019-12-30 DIAGNOSIS — G44219 Episodic tension-type headache, not intractable: Secondary | ICD-10-CM

## 2019-12-30 NOTE — Patient Instructions (Signed)
1.  Continue Aimovig 70mg  monthly 2.  Try decreasing topiramate to 1 tablet at bedtime to see if numbness improves 3.  Continue gabapentin 200mg  4.  Limit use of pain relievers to no more than 2 days out of week to prevent risk of rebound or medication-overuse headache. 5.  Follow up in 6 months.

## 2020-02-10 DIAGNOSIS — A630 Anogenital (venereal) warts: Secondary | ICD-10-CM | POA: Diagnosis not present

## 2020-02-10 DIAGNOSIS — N481 Balanitis: Secondary | ICD-10-CM | POA: Diagnosis not present

## 2020-02-11 DIAGNOSIS — A64 Unspecified sexually transmitted disease: Secondary | ICD-10-CM | POA: Diagnosis not present

## 2020-02-11 DIAGNOSIS — Z6828 Body mass index (BMI) 28.0-28.9, adult: Secondary | ICD-10-CM | POA: Diagnosis not present

## 2020-02-11 DIAGNOSIS — S30822A Blister (nonthermal) of penis, initial encounter: Secondary | ICD-10-CM | POA: Diagnosis not present

## 2020-02-11 DIAGNOSIS — Z1331 Encounter for screening for depression: Secondary | ICD-10-CM | POA: Diagnosis not present

## 2020-02-11 DIAGNOSIS — Z113 Encounter for screening for infections with a predominantly sexual mode of transmission: Secondary | ICD-10-CM | POA: Diagnosis not present

## 2020-02-11 DIAGNOSIS — Z1389 Encounter for screening for other disorder: Secondary | ICD-10-CM | POA: Diagnosis not present

## 2020-02-11 DIAGNOSIS — E663 Overweight: Secondary | ICD-10-CM | POA: Diagnosis not present

## 2020-02-14 DIAGNOSIS — Z0279 Encounter for issue of other medical certificate: Secondary | ICD-10-CM

## 2020-02-21 NOTE — Progress Notes (Signed)
FMLA forms filled out and faxed

## 2020-03-26 DIAGNOSIS — Z20822 Contact with and (suspected) exposure to covid-19: Secondary | ICD-10-CM | POA: Diagnosis not present

## 2020-03-26 DIAGNOSIS — B349 Viral infection, unspecified: Secondary | ICD-10-CM | POA: Diagnosis not present

## 2020-04-13 ENCOUNTER — Other Ambulatory Visit: Payer: Self-pay | Admitting: Neurology

## 2020-05-05 DIAGNOSIS — M7711 Lateral epicondylitis, right elbow: Secondary | ICD-10-CM | POA: Diagnosis not present

## 2020-05-05 DIAGNOSIS — E6609 Other obesity due to excess calories: Secondary | ICD-10-CM | POA: Diagnosis not present

## 2020-05-05 DIAGNOSIS — Z6830 Body mass index (BMI) 30.0-30.9, adult: Secondary | ICD-10-CM | POA: Diagnosis not present

## 2020-06-30 NOTE — Progress Notes (Signed)
NEUROLOGY FOLLOW UP OFFICE NOTE  ATWELL MCDANEL [id number]  Assessment/Plan:   1.  Migraine without aura, without status migrainosus, not intractable 2.  OSA 3.  Bilateral carpal tunnel syndrome  1.  Migraine prevention:  Start nortriptyline 25mg  at bedtime. 2.  Headache rescue:  Sumatriptan 3.  Stop Goodys and all OTC analgesics.  Limit use of pain relievers to no more than 2 days out of week to prevent risk of rebound or medication-overuse headache. 4.  Keep headache diary 5.  Refer to sleep medicine for OSA 6.  Follow up 6 months.  Subjective:  Dylan Shelton is a 45year old right-handed man with OSA who follows up for migraines.  UPDATE: Stopped topiramate to see if paresthesias resolved, which it did.  Stopped gabapentin.  Stopped Emgality  - ineffective.  Intensity: mod-severe Duration: 2 hours with sumatriptan; 4 to 5 hours without management Frequency:daily dull headache but severe one twice a week However, he also reports a dull persistent non-throbbing frontal headache for the past 2 to 3 months. Frequency of abortive medication:Sumatriptan once a week. Excedrin for dull headachedaily.  Current NSAIDS:None Current analgesics:Goody daily Current triptans:Sumatriptan 100 mg Current ergotamine:None Current anti-emetic:None Current muscle relaxants:None Current anti-anxiolytic:None Current sleep aide:None Current Antihypertensive medications:None Current Antidepressant medications:none Current Anticonvulsant medications:none Current anti-CGRP:none Vitamins/Herbal/Supplements:None Current Antihistamines/Decongestants:None Other therapy:None   Caffeine:Stopped Alcohol:No Smoker:Yes Diet:Increased water intake Exercise:Walks Depression:No; Anxiety:No Other pain:Neck pain better Sleep hygiene:Poor. Has uncontrolled OSA.  HISTORY: Onset: around January 2018 Location: right frontal Quality: Mild is dull, severe is  throbbing Initial Intensity: Mild is 4-5/10; severe is 8-9/10 Aura: no Prodrome: no Postdrome: no Associated symptoms: Osmophobia, nausea. No photophobia and phonophobia. He has not had specific visual disturbance with the headache, but he notes seeing spots in his vision and needs to blink in order to focus. He had a formal eye exam which was reportedly normal. He has not had any new worse headache of his life, waking up from sleep Initial Duration: 9-12 hours Initial Frequency: Daily (mild occurs 15 days per month; severe occurs 15 days per month) Initial Frequency of abortive medication: daily Triggers: Certain scents Relieving factors. None Activity: aggravates  Past NSAIDS: ibuprofen, naproxen Past analgesics: Excedrin, Goody Past abortive triptans: no Past muscle relaxants: no Past anti-emetic: no Past antihypertensive medications: no Past antidepressant medications: Lexapro 20mg  Past anticonvulsant medications: topiramate 200mg  daily, gabapentin 20mg  daily Current Past CGRP-inhibitor: Aimovig (aggravated migraines), Emgality Past vitamins/Herbal/Supplements: no Other past therapies: no  CT of head from 11/02/16 was personally reviewed and was normal.  Personal history of headache: He had headaches as a child up through middle school. Family history of headache: No  He has neck pain. Since 2018, he reports shooting pain from both hands radiating up the arms to the shoulders. He also endorses numbness in the hands. He saw orthopedics. MRI of cervical spine from 11/24/16 was personally reviewed and revealed mild degenerative disc bulging at C4-5 through C6-7 with mild right C4 and C5 foraminal stenosis but no significant canal stenosis. He reportedly had a NCV-EMG which showed maybe mild carpal tunnel syndrome in the left hand, as well as chronic radiculopathy. Steroid shots in the carpal tunnel were ineffective. He was placed on gabapentin 300mg   twice daily which helped the pain but he stopped because it may have caused GI upset.  NCV-EMG on 08/21/2019 confirmed moderate right and mild left carpal tunnel syndrome.  He was referred to a hand specialist and underwent carpal tunnel release on right hand.  He no longer has sharp pains.  He notes numbness and tingling in the feet.  PAST MEDICAL HISTORY: Past Medical History:  Diagnosis Date  . High cholesterol   . MRSA (methicillin resistant Staphylococcus aureus)   . Recurrent boils   . Renal disorder   . Sleep apnea     MEDICATIONS: Current Outpatient Medications on File Prior to Visit  Medication Sig Dispense Refill  . Erenumab-aooe (AIMOVIG) 70 MG/ML SOAJ Inject 70 mg into the skin every 30 (thirty) days. 1 pen 11  . escitalopram (LEXAPRO) 10 MG tablet 20 mg.   11  . gabapentin (NEURONTIN) 100 MG capsule TAKE 2 CAPSULES(200 MG) BY MOUTH DAILY 180 capsule 0  . Galcanezumab-gnlm (EMGALITY) 120 MG/ML SOAJ Inject 120 mg into the skin every 28 (twenty-eight) days. 1 pen 11  . naproxen (NAPROSYN) 500 MG tablet TAKE 1 TABLET(500 MG) BY MOUTH EVERY 12 HOURS AS NEEDED 15 tablet 2  . SUMAtriptan (IMITREX) 100 MG tablet Take 1 tablet earliest onset of migraine.  May repeat once in 2 hours if headache persists or recurs. 30 tablet 1  . topiramate (TOPAMAX) 100 MG tablet TAKE 2 TABLETS(200 MG) BY MOUTH AT BEDTIME 180 tablet 2   No current facility-administered medications on file prior to visit.    ALLERGIES: No Known Allergies  FAMILY HISTORY: Family History  Problem Relation Age of Onset  . Melanoma Father   . Cancer Paternal Grandmother      Objective:  Blood pressure 122/77, pulse 69, height 5\' 9"  (1.753 m), weight 208 lb 9.6 oz (94.6 kg), SpO2 99 %. General: No acute distress.  Patient appears well-groomed.       , DO  CC: [name], MD

## 2020-07-02 ENCOUNTER — Ambulatory Visit: Payer: BC Managed Care – PPO | Admitting: Neurology

## 2020-07-02 ENCOUNTER — Other Ambulatory Visit: Payer: Self-pay

## 2020-07-02 ENCOUNTER — Encounter: Payer: Self-pay | Admitting: Neurology

## 2020-07-02 VITALS — BP 122/77 | HR 69 | Ht 69.0 in | Wt 208.6 lb

## 2020-07-02 DIAGNOSIS — G5603 Carpal tunnel syndrome, bilateral upper limbs: Secondary | ICD-10-CM

## 2020-07-02 DIAGNOSIS — G4733 Obstructive sleep apnea (adult) (pediatric): Secondary | ICD-10-CM

## 2020-07-02 DIAGNOSIS — G43709 Chronic migraine without aura, not intractable, without status migrainosus: Secondary | ICD-10-CM | POA: Diagnosis not present

## 2020-07-02 MED ORDER — NORTRIPTYLINE HCL 25 MG PO CAPS: 25.0000 mg | ORAL_CAPSULE | Freq: Every day | ORAL | 5 refills | Status: AC

## 2020-07-02 NOTE — Patient Instructions (Signed)
1.  Refer to sleep medicine regarding sleep apnea 2.  Start nortriptyline 25mg  at bedtime.  Keep headache diary.  If no improvement in headaches, contact me and we can increase dose 3.  Use sumatriptan as prescribed.  Limit use of pain relievers to no more than 2 days out of week to prevent risk of rebound or medication-overuse headache. 4.  Stop over the counter pain relievers (Goody, Excedrin, ibuprofen, Tylenol) 5.  Follow up 6 months.

## 2020-12-02 DIAGNOSIS — U071 COVID-19: Secondary | ICD-10-CM | POA: Diagnosis not present

## 2021-01-05 NOTE — Progress Notes (Deleted)
NEUROLOGY FOLLOW UP OFFICE NOTE  Dylan Shelton [id number]  Assessment/Plan:   1.  Migraine without aura, without status migrainosus, not intractable 2.  OSA 3.  Bilateral carpal tunnel syndrome   1.  Migraine prevention:  Start nortriptyline 25mg  at bedtime. 2.  Headache rescue:  Sumatriptan 3.  Stop Goodys and all OTC analgesics.  Limit use of pain relievers to no more than 2 days out of week to prevent risk of rebound or medication-overuse headache. 4.  Keep headache diary 5.  Refer to sleep medicine for OSA 6.  Follow up 6 months.  Subjective:  Dylan Shelton is a 45 year old right-handed man with OSA who follows up for migraines.   UPDATE: Started nortriptyline in February and advised to discontinue OTC analgesics..   Intensity:  mod-severe Duration:  2 hours with sumatriptan; 4 to 5 hours without management Frequency:  daily dull headache but severe one twice a week However, he also reports a dull persistent non-throbbing frontal headache for the past 2 to 3 months. Frequency of abortive medication: Sumatriptan *** Current NSAIDS: None Current analgesics: none Current triptans: Sumatriptan 100 mg Current ergotamine: None Current anti-emetic: None Current muscle relaxants: None Current anti-anxiolytic: None Current sleep aide: None Current Antihypertensive medications: None Current Antidepressant medications: none Current Anticonvulsant medications: none Current anti-CGRP: none Vitamins/Herbal/Supplements: None Current Antihistamines/Decongestants: None Other therapy: None     Caffeine: Stopped Alcohol: No Smoker: Yes Diet: Increased water intake Exercise: Walks Depression: No; Anxiety: No Other pain: Neck pain better Sleep hygiene: Poor.  Has uncontrolled OSA.  He was referred to sleep medicine but ***   HISTORY: Onset: around January 2018 Location:  right frontal Quality:  Mild is dull, severe is throbbing Initial Intensity:  Mild is 4-5/10; severe is  8-9/10 Aura:  no Prodrome:  no Postdrome:  no Associated symptoms:  Osmophobia, nausea.  No photophobia and phonophobia. He has not had specific visual disturbance with the headache, but he notes seeing spots in his vision and needs to blink in order to focus.  He had a formal eye exam which was reportedly normal.  He has not had any new worse headache of his life, waking up from sleep Initial Duration:  9-12 hours Initial Frequency:  Daily (mild occurs 15 days per month; severe occurs 15 days per month) Initial Frequency of abortive medication: daily Triggers: Certain scents Relieving factors.  None Activity:  aggravates   Past NSAIDS:  ibuprofen, naproxen Past analgesics:  Excedrin, Goody Past abortive triptans:  no Past muscle relaxants:  no Past anti-emetic:  no Past antihypertensive medications:  no Past antidepressant medications:  Lexapro 20mg  Past anticonvulsant medications:  topiramate 200mg  daily (paresthesias), gabapentin 20mg  daily Current Past CGRP-inhibitor:  Aimovig (aggravated migraines), Emgality (ineffective) Past vitamins/Herbal/Supplements:  no Other past therapies:  no   CT of head from 11/02/16 was personally reviewed and was normal.   Personal history of headache:  He had headaches as a child up through middle school. Family history of headache:  No   He has neck pain.  Since 2018, he reports shooting pain from both hands radiating up the arms to the shoulders.  He also endorses numbness in the hands.  He saw orthopedics.  MRI of cervical spine from 11/24/16 was personally reviewed and revealed mild degenerative disc bulging at C4-5 through C6-7 with mild right C4 and C5 foraminal stenosis but no significant canal stenosis.  He reportedly had a NCV-EMG which showed maybe mild carpal tunnel syndrome in the  left hand, as well as chronic radiculopathy.  Steroid shots in the carpal tunnel were ineffective.  He was placed on gabapentin 300mg  twice daily which helped the  pain but he stopped because it may have caused GI upset.  NCV-EMG on 08/21/2019 confirmed moderate right and mild left carpal tunnel syndrome.  He was referred to a hand specialist and underwent carpal tunnel release on right hand.  He no longer has sharp pains.  He notes numbness and tingling in the feet.  PAST MEDICAL HISTORY: Past Medical History:  Diagnosis Date   High cholesterol    MRSA (methicillin resistant Staphylococcus aureus)    Recurrent boils    Renal disorder    Sleep apnea     MEDICATIONS: Current Outpatient Medications on File Prior to Visit  Medication Sig Dispense Refill   Erenumab-aooe (AIMOVIG) 70 MG/ML SOAJ Inject 70 mg into the skin every 30 (thirty) days. (Patient not taking: Reported on 07/02/2020) 1 pen 11   gabapentin (NEURONTIN) 100 MG capsule TAKE 2 CAPSULES(200 MG) BY MOUTH DAILY (Patient not taking: Reported on 07/02/2020) 180 capsule 0   Galcanezumab-gnlm (EMGALITY) 120 MG/ML SOAJ Inject 120 mg into the skin every 28 (twenty-eight) days. (Patient not taking: Reported on 07/02/2020) 1 pen 11   naproxen (NAPROSYN) 500 MG tablet TAKE 1 TABLET(500 MG) BY MOUTH EVERY 12 HOURS AS NEEDED (Patient not taking: Reported on 07/02/2020) 15 tablet 2   nortriptyline (PAMELOR) 25 MG capsule Take 1 capsule (25 mg total) by mouth at bedtime. 30 capsule 5   SUMAtriptan (IMITREX) 100 MG tablet Take 1 tablet earliest onset of migraine.  May repeat once in 2 hours if headache persists or recurs. 30 tablet 1   topiramate (TOPAMAX) 100 MG tablet TAKE 2 TABLETS(200 MG) BY MOUTH AT BEDTIME (Patient not taking: Reported on 07/02/2020) 180 tablet 2   No current facility-administered medications on file prior to visit.    ALLERGIES: No Known Allergies  FAMILY HISTORY: Family History  Problem Relation Age of Onset   Melanoma Father    Cancer Paternal Grandmother       Objective:  *** General: No acute distress.  Patient appears ***-groomed.   Head:   Normocephalic/atraumatic Eyes:  Fundi examined but not visualized Neck: supple, no paraspinal tenderness, full range of motion Heart:  Regular rate and rhythm Lungs:  Clear to auscultation bilaterally Back: No paraspinal tenderness Neurological Exam: alert and oriented to person, place, and time.  Speech fluent and not dysarthric, language intact.  CN II-XII intact. Bulk and tone normal, muscle strength 5/5 throughout.  Sensation to light touch intact.  Deep tendon reflexes 2+ throughout, toes downgoing.  Finger to nose testing intact.  Gait normal, Romberg negative.   07/04/2020, DO  CC: ***

## 2021-01-06 ENCOUNTER — Ambulatory Visit: Payer: BC Managed Care – PPO | Admitting: Neurology

## 2021-07-23 DIAGNOSIS — G43909 Migraine, unspecified, not intractable, without status migrainosus: Secondary | ICD-10-CM | POA: Diagnosis not present

## 2021-08-13 DIAGNOSIS — R030 Elevated blood-pressure reading, without diagnosis of hypertension: Secondary | ICD-10-CM | POA: Diagnosis not present

## 2021-08-13 DIAGNOSIS — G43909 Migraine, unspecified, not intractable, without status migrainosus: Secondary | ICD-10-CM | POA: Diagnosis not present

## 2021-08-13 DIAGNOSIS — Z0000 Encounter for general adult medical examination without abnormal findings: Secondary | ICD-10-CM | POA: Diagnosis not present

## 2021-08-13 DIAGNOSIS — Z125 Encounter for screening for malignant neoplasm of prostate: Secondary | ICD-10-CM | POA: Diagnosis not present

## 2021-08-23 DIAGNOSIS — G43909 Migraine, unspecified, not intractable, without status migrainosus: Secondary | ICD-10-CM | POA: Diagnosis not present

## 2021-08-23 DIAGNOSIS — T148XXA Other injury of unspecified body region, initial encounter: Secondary | ICD-10-CM | POA: Diagnosis not present

## 2021-09-03 NOTE — Progress Notes (Signed)
Record request received from [facility]. ? ?Last two ov notes faxed over.  ?

## 2021-09-07 ENCOUNTER — Ambulatory Visit: Payer: BC Managed Care – PPO | Admitting: Neurology
